# Patient Record
Sex: Female | Born: 1965 | Race: White | Hispanic: No | Marital: Single | Smoking: Never smoker
Health system: Southern US, Community
[De-identification: ages and names within clinical notes are randomized; demographics above are authoritative.]

## PROBLEM LIST (undated history)

## (undated) DIAGNOSIS — E119 Type 2 diabetes mellitus without complications: Secondary | ICD-10-CM

## (undated) DIAGNOSIS — I471 Supraventricular tachycardia, unspecified: Secondary | ICD-10-CM

## (undated) DIAGNOSIS — Z8679 Personal history of other diseases of the circulatory system: Secondary | ICD-10-CM

## (undated) DIAGNOSIS — I5189 Other ill-defined heart diseases: Secondary | ICD-10-CM

## (undated) DIAGNOSIS — I10 Essential (primary) hypertension: Secondary | ICD-10-CM

## (undated) DIAGNOSIS — F79 Unspecified intellectual disabilities: Secondary | ICD-10-CM

## (undated) DIAGNOSIS — E669 Obesity, unspecified: Secondary | ICD-10-CM

## (undated) DIAGNOSIS — E785 Hyperlipidemia, unspecified: Secondary | ICD-10-CM

## (undated) DIAGNOSIS — I2699 Other pulmonary embolism without acute cor pulmonale: Secondary | ICD-10-CM

## (undated) DIAGNOSIS — K8689 Other specified diseases of pancreas: Secondary | ICD-10-CM

## (undated) DIAGNOSIS — I480 Paroxysmal atrial fibrillation: Secondary | ICD-10-CM

## (undated) DIAGNOSIS — K061 Gingival enlargement: Secondary | ICD-10-CM

## (undated) DIAGNOSIS — I499 Cardiac arrhythmia, unspecified: Secondary | ICD-10-CM

## (undated) HISTORY — DX: Other ill-defined heart diseases: I51.89

## (undated) HISTORY — DX: Other pulmonary embolism without acute cor pulmonale: I26.99

## (undated) HISTORY — DX: Gingival enlargement: K06.1

## (undated) HISTORY — DX: Supraventricular tachycardia: I47.1

## (undated) HISTORY — DX: Hyperlipidemia, unspecified: E78.5

## (undated) HISTORY — DX: Cardiac arrhythmia, unspecified: I49.9

## (undated) HISTORY — DX: Supraventricular tachycardia, unspecified: I47.10

## (undated) HISTORY — DX: Personal history of other diseases of the circulatory system: Z86.79

## (undated) HISTORY — PX: OOPHORECTOMY: SHX86

## (undated) HISTORY — DX: Unspecified intellectual disabilities: F79

## (undated) HISTORY — DX: Paroxysmal atrial fibrillation: I48.0

## (undated) HISTORY — PX: ABDOMINAL HYSTERECTOMY: SHX81

## (undated) HISTORY — PX: TONSILLECTOMY: SUR1361

## (undated) HISTORY — DX: Other specified diseases of pancreas: K86.89

## (undated) HISTORY — DX: Obesity, unspecified: E66.9

---

## 2010-06-11 DIAGNOSIS — I2699 Other pulmonary embolism without acute cor pulmonale: Secondary | ICD-10-CM

## 2010-06-11 HISTORY — DX: Other pulmonary embolism without acute cor pulmonale: I26.99

## 2010-11-10 ENCOUNTER — Ambulatory Visit: Payer: Self-pay | Admitting: Internal Medicine

## 2010-12-10 ENCOUNTER — Ambulatory Visit: Payer: Self-pay | Admitting: Internal Medicine

## 2010-12-22 ENCOUNTER — Ambulatory Visit: Payer: Self-pay | Admitting: Unknown Physician Specialty

## 2011-01-10 ENCOUNTER — Ambulatory Visit: Payer: Self-pay | Admitting: Internal Medicine

## 2011-01-10 DIAGNOSIS — K8689 Other specified diseases of pancreas: Secondary | ICD-10-CM

## 2011-01-10 HISTORY — DX: Other specified diseases of pancreas: K86.89

## 2011-02-10 DIAGNOSIS — I499 Cardiac arrhythmia, unspecified: Secondary | ICD-10-CM

## 2011-02-10 HISTORY — DX: Cardiac arrhythmia, unspecified: I49.9

## 2011-02-10 HISTORY — PX: SPLENECTOMY: SUR1306

## 2011-03-29 ENCOUNTER — Encounter: Payer: Self-pay | Admitting: Cardiovascular Disease

## 2011-03-29 ENCOUNTER — Ambulatory Visit (INDEPENDENT_AMBULATORY_CARE_PROVIDER_SITE_OTHER): Payer: Medicaid Other | Admitting: Cardiovascular Disease

## 2011-03-29 VITALS — BP 100/68 | HR 87 | Resp 16 | Ht 68.0 in | Wt 183.8 lb

## 2011-03-29 DIAGNOSIS — I471 Supraventricular tachycardia, unspecified: Secondary | ICD-10-CM | POA: Insufficient documentation

## 2011-03-29 DIAGNOSIS — I4891 Unspecified atrial fibrillation: Secondary | ICD-10-CM

## 2011-03-29 DIAGNOSIS — E1169 Type 2 diabetes mellitus with other specified complication: Secondary | ICD-10-CM | POA: Insufficient documentation

## 2011-03-29 DIAGNOSIS — I2699 Other pulmonary embolism without acute cor pulmonale: Secondary | ICD-10-CM

## 2011-03-29 DIAGNOSIS — K869 Disease of pancreas, unspecified: Secondary | ICD-10-CM

## 2011-03-29 DIAGNOSIS — E785 Hyperlipidemia, unspecified: Secondary | ICD-10-CM

## 2011-03-29 MED ORDER — METOPROLOL TARTRATE 50 MG PO TABS
50.0000 mg | ORAL_TABLET | Freq: Two times a day (BID) | ORAL | Status: DC
Start: 2011-03-29 — End: 2021-05-08

## 2011-03-29 MED ORDER — DILTIAZEM HCL ER COATED BEADS 240 MG PO CP24: 240.0000 mg | ORAL_CAPSULE | Freq: Every day | ORAL | Status: DC

## 2011-03-29 NOTE — Assessment & Plan Note (Signed)
History of PE. This is likely secondary to a prolonged sedentary. Following her surgery and inadequate DVT prophylaxis. She is on warfarin for at least 6 months.

## 2011-03-29 NOTE — Assessment & Plan Note (Signed)
Notes indicate a history of hyperlipidemia.  We would be in no rush to add a statin given the continued weight loss.

## 2011-03-29 NOTE — Assessment & Plan Note (Addendum)
She is currently maintaining normal sinus rhythm. I suspect the atrial fibrillation could have come about secondary to the postoperative stressors, including the PE, pain, fluids given during surgery, etc.  Her blood pressure is low on today's visit and she is on a very high dose of diltiazem. Sinus Rate continues to be mildly elevated. We will decrease the diltiazem back to 240 mg daily and add metoprolol tartrate 50 mg b.i.d.. I've asked her family member to closely monitor her blood pressure and heart rate and pulse and call us with some numbers In the next week.  We will try to obtain any cardiac studies such as echocardiogram from [facility].

## 2011-03-29 NOTE — Assessment & Plan Note (Signed)
Notes indicate a benign lesion. She appears to be recovering well though does continue to have nausea, poor p.o. intake

## 2011-03-29 NOTE — Patient Instructions (Addendum)
You are doing well.  Your physician has recommended you make the following change in your medication: DECREASE Diltiazem to 240mg  ONE tablet at night. START Metoprolol Tartrate 50mg  Twice daily.   Monitor the heart rate and blood pressure Call the office if the blood pressure is running low (100 or less on the top)  Please call us if you have new issues that need to be addressed before your next appt.  The office will contact you for a follow up Appt. In 6 months

## 2011-03-29 NOTE — Progress Notes (Signed)
Patient ID: Briana Young, female    DOB: [birth year], 45 y.o.   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 Greggory Stallion for evaluation of her cardiac issues.  She reports that currently she feels well. She has TPN as she is not eating very much. She does have chronic nausea. She has lost 35 pounds since the surgery. She denies any significant lower extremity edema. No significant shortness of breath with exertion. She denies any tachypalpitations concerning for atrial fibrillation. She does have occasional lightheadedness.  She reports that she was on verapamil 240 mg daily for a long period of time. This was recently changed to diltiazem. We contacted [facility] and they report that the prescription is for diltiazem is 240 mg, 2 tabs daily For a total of 480 mg per day  EKG today shows normal sinus rhythm with rate 88 beats per minute, first degree AV block, no significant ST or T wave changes    Outpatient Encounter Prescriptions as of 03/29/2011  Medication Sig Dispense Refill  . acetaminophen (TYLENOL) 325 MG tablet Take 650 mg by mouth every 6 (six) hours as needed.        . ciprofloxacin (CIPRO) 500 MG tablet Take 500 mg by mouth 2 (two) times daily.        Marland Kitchen diltiazem (CARDIZEM CD) 240 MG 24 hr capsule Take 1 capsule (240 mg total) by mouth at bedtime. Take one tablet at bedtime.  30 capsule  6  . estropipate (OGEN) 1.5 MG tablet Take 2-4 tablets daily.       Marland Kitchen loperamide (IMODIUM A-D) 2 MG tablet Take 2 mg by mouth 4 (four) times daily as needed.        Marland Kitchen omeprazole (PRILOSEC) 20 MG capsule Take 20 mg by mouth daily.        . ondansetron (ZOFRAN) 4 MG tablet Take 4 mg by mouth every 8 (eight) hours as needed.        Marland Kitchen oxybutynin  (DITROPAN) 5 MG tablet Take 5 mg by mouth 3 (three) times daily.        Marland Kitchen PARoxetine (PAXIL) 30 MG tablet Take 30 mg by mouth every morning.        Marland Kitchen POLYETHYLENE GLYCOL 3350 PO Take by mouth.        . promethazine (PHENERGAN) 12.5 MG tablet Take 12.5 mg by mouth every 6 (six) hours as needed.        . warfarin (COUMADIN) 5 MG tablet Take 5 mg by mouth daily.        Marland Kitchen  diltiazem (CARDIZEM CD) 240 MG 24 hr capsule Take 240 mg by mouth daily. Take two tablet at bedtime.         Review of Systems  Constitutional: Positive for activity change, appetite change, fatigue and unexpected weight change.  HENT: Negative.   Eyes: Negative.   Respiratory: Negative.   Cardiovascular: Negative.   Gastrointestinal: Negative.   Musculoskeletal: Negative.   Skin: Negative.   Neurological: Positive for dizziness and weakness.  Hematological: Negative.   Psychiatric/Behavioral: Negative.   All other systems reviewed and are negative.    BP 100/68  Pulse 87  Resp 16  Ht 5\' 8"  (1.727 m)  Wt 183 lb 12 oz (83.348 kg)  BMI 27.94 kg/m2   Physical Exam  Nursing note and vitals reviewed. Constitutional: She is oriented to person, place, and time. She appears well-developed and well-nourished.  HENT:  Head: Normocephalic.  Nose: Nose normal.  Mouth/Throat: Oropharynx is clear and moist.  Eyes: Conjunctivae are normal. Pupils are equal, round, and reactive to light.  Neck: Normal range of motion. Neck supple. No JVD present.  Cardiovascular: Regular rhythm, S1 normal, S2 normal, normal heart sounds and intact distal pulses.  Tachycardia present.  Exam reveals no gallop and no friction rub.   No murmur heard. Pulmonary/Chest: Effort normal and breath sounds normal. No respiratory distress. She has no wheezes. She has no rales. She exhibits no tenderness.  Abdominal: Soft. Bowel sounds are normal. She exhibits no distension. There is no tenderness.  Musculoskeletal: Normal range of motion. She exhibits  no edema and no tenderness.  Lymphadenopathy:    She has no cervical adenopathy.  Neurological: She is alert and oriented to person, place, and time. Coordination normal.  Skin: Skin is warm and dry. No rash noted. No erythema.  Psychiatric: She has a normal mood and affect. Her behavior is normal. Judgment and thought content normal.         Assessment and Plan

## 2011-04-05 ENCOUNTER — Ambulatory Visit: Payer: Self-pay | Admitting: Internal Medicine

## 2011-04-12 ENCOUNTER — Ambulatory Visit: Payer: Self-pay | Admitting: Internal Medicine

## 2011-09-27 ENCOUNTER — Ambulatory Visit: Payer: Self-pay | Admitting: Family Medicine

## 2016-01-12 ENCOUNTER — Other Ambulatory Visit: Payer: Self-pay | Admitting: Family Medicine

## 2016-01-12 DIAGNOSIS — Z1231 Encounter for screening mammogram for malignant neoplasm of breast: Secondary | ICD-10-CM

## 2016-01-26 ENCOUNTER — Ambulatory Visit
Admission: RE | Admit: 2016-01-26 | Discharge: 2016-01-26 | Disposition: A | Discharge status: Home / Self Care | Payer: Medicare Other | Source: Ambulatory Visit | Attending: Family Medicine | Admitting: Family Medicine

## 2016-01-26 ENCOUNTER — Encounter: Payer: Self-pay | Admitting: Radiology

## 2016-01-26 DIAGNOSIS — Z1231 Encounter for screening mammogram for malignant neoplasm of breast: Secondary | ICD-10-CM | POA: Insufficient documentation

## 2017-11-29 ENCOUNTER — Encounter: Payer: Self-pay | Admitting: Emergency Medicine

## 2017-11-29 ENCOUNTER — Other Ambulatory Visit: Payer: Self-pay

## 2017-11-29 ENCOUNTER — Ambulatory Visit
Admission: EM | Admit: 2017-11-29 | Discharge: 2017-11-29 | Disposition: A | Discharge status: Home / Self Care | Payer: Medicare Other | Attending: Emergency Medicine | Admitting: Emergency Medicine

## 2017-11-29 DIAGNOSIS — S76212A Strain of adductor muscle, fascia and tendon of left thigh, initial encounter: Secondary | ICD-10-CM | POA: Diagnosis not present

## 2017-11-29 NOTE — ED Provider Notes (Signed)
HPI  SUBJECTIVE:  Briana Young is a 52 y.o. female who presents with 2 days of sharp left groin pain present only with walking.  She denies nausea, vomiting, fevers, abdominal pain.  No urinary complaints.  No back pain.  No hip pain, swelling, labial swelling.  Caregiver states that she was dancing and the symptoms started the day after.  states that normally she is very sedentary.  No fall, trauma to the area.  Caregiver states that the patient is limping.  They tried Tylenol with improvement in her symptoms.  Symptoms are worse with flexion, abduction/adduction and with walking.  Past medical history of diabetes, atrial fibrillation on Coumadin, PE, mental retardation, UTI.  No history of osteoarthritis of the hip, hernia.  [facility]  History obtained from caregivers and patient.  Past Medical History:  Diagnosis Date  . Allergic rhinitis   . Arrhythmia 02/2011   A-fib  during admission for pancreatic mass excision with subsequent development of PE  . Gingival hyperplasia   . History of atrial tachycardia    premature  . Hyperlipidemia   . Mental retardation   . Obesity   . Pancreatic mass 01/2011   benign  . Pulmonary embolism (HCC) 2012   on coumadin    Past Surgical History:  Procedure Laterality Date  . 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 TONSILLECTOMY      Family History  Problem Relation Age of Onset  . Heart disease Father     Social History   Tobacco Use  . Smoking status: Never Smoker  Substance Use Topics  . Alcohol use: No  . Drug use: Not on file    No current facility-administered medications for this encounter.   Current Outpatient Medications:  .  cetirizine (ZYRTEC) 10 MG chewable tablet, Chew 10 mg by mouth daily., Disp: , Rfl:  .  fluticasone (FLONASE) 50 MCG/ACT nasal spray, Place into both nostrils daily., Disp: , Rfl:  .  glipiZIDE (GLUCOTROL) 5 MG tablet, Take by mouth daily before breakfast., Disp: , Rfl:  .   lisinopril (PRINIVIL,ZESTRIL) 2.5 MG tablet, Take 2.5 mg by mouth daily., Disp: , Rfl:  .  metFORMIN (GLUCOPHAGE) 1000 MG tablet, Take 1,000 mg by mouth 2 (two) times daily with a meal., Disp: , Rfl:  .  acetaminophen (TYLENOL) 325 MG tablet, Take 650 mg by mouth every 6 (six) hours as needed.  , Disp: , Rfl:  .  ciprofloxacin (CIPRO) 500 MG tablet, Take 500 mg by mouth 2 (two) times daily.  , Disp: , Rfl:  .  diltiazem (CARDIZEM CD) 240 MG 24 hr capsule, Take 1 capsule (240 mg total) by mouth at bedtime. Take one tablet at bedtime., Disp: 30 capsule, Rfl: 6 .  estropipate (OGEN) 1.5 MG tablet, Take 2-4 tablets daily. , Disp: , Rfl:  .  loperamide (IMODIUM A-D) 2 MG tablet, Take 2 mg by mouth 4 (four) times daily as needed.  , Disp: , Rfl:  .  metoprolol (LOPRESSOR) 50 MG tablet, Take 1 tablet (50 mg total) by mouth 2 (two) times daily., Disp: 60 tablet, Rfl: 6 .  omeprazole (PRILOSEC) 20 MG capsule, Take 20 mg by mouth daily.  , Disp: , Rfl:  .  ondansetron (ZOFRAN) 4 MG tablet, Take 4 mg by mouth every 8 (eight) hours as needed.  , Disp: , Rfl:  .  oxybutynin (DITROPAN) 5 MG tablet, Take 5 mg by mouth 3 (three)  times daily.  , Disp: , Rfl:  .  PARoxetine (PAXIL) 30 MG tablet, Take 30 mg by mouth every morning.  , Disp: , Rfl:  .  POLYETHYLENE GLYCOL 3350 PO, Take by mouth.  , Disp: , Rfl:  .  promethazine (PHENERGAN) 12.5 MG tablet, Take 12.5 mg by mouth every 6 (six) hours as needed.  , Disp: , Rfl:  .  warfarin (COUMADIN) 5 MG tablet, Take 5 mg by mouth daily.  , Disp: , Rfl:   No Known Allergies   ROS  As noted in HPI.   Physical Exam  BP 109/62   Pulse 81   Temp 98.4 F (36.9 C) (Oral)   Resp 18   Ht 5\' 8"  (1.727 m)   Wt 174 lb (78.9 kg)   SpO2 97%   BMI 26.46 kg/m   Constitutional: Well developed, well nourished, no acute distress Eyes:  EOMI, conjunctiva normal bilaterally HENT: Normocephalic, atraumatic,mucus membranes moist Respiratory: Normal inspiratory  effort Cardiovascular: Normal rate GI: Normal appearance, soft, nontender, nondistended, active bowel sounds.  No guarding, rebound. Back: No CVAT. GU: Normal external labia.  Mild tenderness along the left labia.  No swelling, erythema.  No palpable masses.  No bulging with Valsalva.  No tenderness along the inguinal area.  Caregivers present during exam skin: No rash, skin intact Musculoskeletal: no deformities, normal appearance of the inner thigh/groin area.  Positive tenderness at the insertion of the adductor longus/inner left groin.  Pain with abduction/adduction, internal/external rotation, flexion.  No pain with extension.  No rash.  No tenderness along the thigh, hip, knee.  Normal appearance of the proximal leg and knee.Marland Kitchen  Positive antalgic gait. Neurologic: Alert & oriented x 3, no focal neuro deficits Psychiatric: Speech and behavior appropriate   ED Course   Medications - No data to display  No orders of the defined types were placed in this encounter.   No results found for this or any previous visit (from the past 24 hour(s)). No results found.  ED Clinical Impression  Groin strain, left, initial encounter   ED Assessment/Plan  Patient most consistent with a pulled groin muscle.  There is no evidence of an inguinal hernia, labial infection, intra-abdominal process.  Her abdominal exam is completely benign.  Doubt septic joint as she is tender along the adductor longus At the insertion. Will increase her Tylenol 1000 mg every 6-8 hours as needed for pain.  No muscle relaxants as I do not want to increase her risk for falls.  Ice for 20 minutes at a time.  Follow-up with Dr. Letta Pate, [facility] as needed.  To the ER if she gets worse.  Discussed MDM, treatment plan, and plan for follow-up with caregivers. Discussed sn/sx that should prompt return to the ED. caregivers agree with plan.   No orders of the defined types were placed in this encounter.   *This  clinic note was created using Dragon dictation software. Therefore, there may be occasional mistakes despite careful proofreading.   ?   Domenick Gong, MD 11/29/17 1137

## 2017-11-29 NOTE — ED Triage Notes (Signed)
Patient states she is having lower left side abdominal pain, states it is better when lying down but worse when walking, it has been approx. 3 days

## 2017-11-29 NOTE — Discharge Instructions (Addendum)
Two 500 mg Tylenols, which is 1000 mg a day, 3 or 4 times a day as needed for pain.  Ice for 20 minutes at a time.  Go to the ER for fevers above 100.4, labial swelling, vomiting, abdominal pain, or other concerns

## 2018-02-01 ENCOUNTER — Ambulatory Visit
Admission: EM | Admit: 2018-02-01 | Discharge: 2018-02-01 | Disposition: A | Discharge status: Home / Self Care | Payer: Medicare Other | Attending: Physician Assistant | Admitting: Physician Assistant

## 2018-02-01 ENCOUNTER — Ambulatory Visit: Payer: Medicare Other

## 2018-02-01 ENCOUNTER — Other Ambulatory Visit: Payer: Self-pay

## 2018-02-01 DIAGNOSIS — Z9081 Acquired absence of spleen: Secondary | ICD-10-CM | POA: Insufficient documentation

## 2018-02-01 DIAGNOSIS — F79 Unspecified intellectual disabilities: Secondary | ICD-10-CM | POA: Diagnosis not present

## 2018-02-01 DIAGNOSIS — R42 Dizziness and giddiness: Secondary | ICD-10-CM | POA: Diagnosis not present

## 2018-02-01 DIAGNOSIS — I10 Essential (primary) hypertension: Secondary | ICD-10-CM | POA: Diagnosis not present

## 2018-02-01 DIAGNOSIS — Z7901 Long term (current) use of anticoagulants: Secondary | ICD-10-CM | POA: Diagnosis not present

## 2018-02-01 DIAGNOSIS — E119 Type 2 diabetes mellitus without complications: Secondary | ICD-10-CM | POA: Insufficient documentation

## 2018-02-01 DIAGNOSIS — I4891 Unspecified atrial fibrillation: Secondary | ICD-10-CM | POA: Insufficient documentation

## 2018-02-01 DIAGNOSIS — S20211A Contusion of right front wall of thorax, initial encounter: Secondary | ICD-10-CM | POA: Diagnosis not present

## 2018-02-01 DIAGNOSIS — M791 Myalgia, unspecified site: Secondary | ICD-10-CM | POA: Diagnosis present

## 2018-02-01 DIAGNOSIS — N3000 Acute cystitis without hematuria: Secondary | ICD-10-CM | POA: Diagnosis not present

## 2018-02-01 DIAGNOSIS — E785 Hyperlipidemia, unspecified: Secondary | ICD-10-CM | POA: Insufficient documentation

## 2018-02-01 DIAGNOSIS — Z86711 Personal history of pulmonary embolism: Secondary | ICD-10-CM | POA: Diagnosis not present

## 2018-02-01 DIAGNOSIS — Z7984 Long term (current) use of oral hypoglycemic drugs: Secondary | ICD-10-CM | POA: Diagnosis not present

## 2018-02-01 DIAGNOSIS — W19XXXA Unspecified fall, initial encounter: Secondary | ICD-10-CM | POA: Diagnosis not present

## 2018-02-01 DIAGNOSIS — Z8249 Family history of ischemic heart disease and other diseases of the circulatory system: Secondary | ICD-10-CM | POA: Diagnosis not present

## 2018-02-01 DIAGNOSIS — Z7951 Long term (current) use of inhaled steroids: Secondary | ICD-10-CM | POA: Diagnosis not present

## 2018-02-01 DIAGNOSIS — Z79899 Other long term (current) drug therapy: Secondary | ICD-10-CM | POA: Insufficient documentation

## 2018-02-01 LAB — URINALYSIS, COMPLETE (UACMP) WITH MICROSCOPIC
BILIRUBIN URINE: NEGATIVE
Glucose, UA: NEGATIVE mg/dL
HGB URINE DIPSTICK: NEGATIVE
KETONES UR: NEGATIVE mg/dL
Leukocytes, UA: NEGATIVE
NITRITE: NEGATIVE
PROTEIN: NEGATIVE mg/dL
SPECIFIC GRAVITY, URINE: 1.015 (ref 1.005–1.030)
pH: 6 (ref 5.0–8.0)

## 2018-02-01 MED ORDER — SULFAMETHOXAZOLE-TRIMETHOPRIM 800-160 MG PO TABS: 1.0000 | ORAL_TABLET | Freq: Two times a day (BID) | ORAL | 0 refills | Status: AC

## 2018-02-01 NOTE — ED Provider Notes (Addendum)
[facility]    CSN: [id number] Arrival date & time: 02/01/18  [id number]     History   Chief Complaint Chief Complaint  Patient presents with  . Muscle Pain    HPI Briana Young is a 52 y.o. female. Patient presents with caregiver. She is from a group home. Caregiver and patient say that she fell onto her right side last night. The fall was not witnessed and the patient complained about the fall and subsequent right rib pain this morning to staff. She says that it does hurt to take a deep breath, but denies breathing difficulty. She states that she gets dizzy sometimes. Caregiver denies frequent falls. Patient does have a history of diabetes, hypertension with low normal BP readings, and mental retardation. Caregiver states fingerstick glucose was 118 this morning, just prior to arrival to the Urgent Care. Denies LOC and headache. Denies other injuries. There are no lacerations, bruises, abrasions.   HPI  Past Medical History:  Diagnosis Date  . Allergic rhinitis   . Arrhythmia 02/2011   A-fib  during admission for pancreatic mass excision with subsequent development of PE  . Gingival hyperplasia   . History of atrial tachycardia    premature  . Hyperlipidemia   . Mental retardation   . Obesity   . Pancreatic mass 01/2011   benign  . Pulmonary embolism (HCC) 2012   on coumadin    Patient Active Problem List   Diagnosis Date Noted  . Atrial fibrillation (HCC) 03/29/2011  . Hyperlipidemia 03/29/2011  . Pulmonary embolism (HCC) 03/29/2011  . Pancreatic disorder 03/29/2011    Past Surgical History:  Procedure Laterality Date  . 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 TONSILLECTOMY      OB History   None      Home Medications    Prior to Admission medications   Medication Sig Start Date End Date Taking? Authorizing Provider  acetaminophen (TYLENOL) 325 MG tablet Take 650 mg by mouth every 6 (six) hours as needed.      [provider]  cetirizine (ZYRTEC)  10 MG chewable tablet Chew 10 mg by mouth daily.    [provider]  ciprofloxacin (CIPRO) 500 MG tablet Take 500 mg by mouth 2 (two) times daily.      [provider]  diltiazem (CARDIZEM CD) 240 MG 24 hr capsule Take 1 capsule (240 mg total) by mouth at bedtime. Take one tablet at bedtime. 03/29/11   Antonieta IbaGollan, Timothy J, MD  estropipate (OGEN) 1.5 MG tablet Take 2-4 tablets daily.     [provider]  fluticasone (FLONASE) 50 MCG/ACT nasal spray Place into both nostrils daily.    [provider]  glipiZIDE (GLUCOTROL) 5 MG tablet Take by mouth daily before breakfast.    [provider]  lisinopril (PRINIVIL,ZESTRIL) 2.5 MG tablet Take 2.5 mg by mouth daily.    [provider]  loperamide (IMODIUM A-D) 2 MG tablet Take 2 mg by mouth 4 (four) times daily as needed.      [provider]  metFORMIN (GLUCOPHAGE) 1000 MG tablet Take 1,000 mg by mouth 2 (two) times daily with a meal.    [provider]  metoprolol (LOPRESSOR) 50 MG tablet Take 1 tablet (50 mg total) by mouth 2 (two) times daily. 03/29/11 03/28/12  Antonieta IbaGollan, Timothy J, MD  omeprazole (PRILOSEC) 20 MG capsule Take 20 mg by mouth daily.      [provider]  ondansetron [facility](ZOFRAN) 4  MG tablet Take 4 mg by mouth every 8 (eight) hours as needed.      [provider]  oxybutynin (DITROPAN) 5 MG tablet Take 5 mg by mouth 3 (three) times daily.      [provider]  PARoxetine (PAXIL) 30 MG tablet Take 30 mg by mouth every morning.      [provider]  POLYETHYLENE GLYCOL 3350 PO Take by mouth.      [provider]  promethazine (PHENERGAN) 12.5 MG tablet Take 12.5 mg by mouth every 6 (six) hours as needed.      [provider]  sulfamethoxazole-trimethoprim (BACTRIM DS,SEPTRA DS) 800-160 MG tablet Take 1 tablet by mouth 2 (two) times daily for 3 days. 02/01/18 02/04/18  Eusebio Friendly B, PA-C  warfarin (COUMADIN) 5 MG tablet  Take 5 mg by mouth daily.      [provider]    Family History Family History  Problem Relation Age of Onset  . Heart disease Father     Social History Social History   Tobacco Use  . Smoking status: Never Smoker  . Smokeless tobacco: Never Used  Substance Use Topics  . Alcohol use: No  . Drug use: Not on file     Allergies   Patient has no known allergies.   Review of Systems Review of Systems  Constitutional: Negative for fatigue and fever.  HENT: Negative for ear discharge.   Eyes: Negative for photophobia and visual disturbance.  Respiratory: Negative for cough, chest tightness, shortness of breath and wheezing.   Cardiovascular: Negative for chest pain and palpitations.  Gastrointestinal: Negative for abdominal pain, nausea and vomiting.  Genitourinary: Positive for dysuria. Negative for difficulty urinating and urgency.  Musculoskeletal: Positive for arthralgias (pain of right ribs) and myalgias (pain of muscles or right thorax). Negative for gait problem.  Skin: Negative for color change, rash and wound.  Neurological: Positive for dizziness. Negative for syncope and headaches.  Psychiatric/Behavioral: Negative for confusion.     Physical Exam Triage Vital Signs ED Triage Vitals [02/01/18 0907]  Enc Vitals Group     BP      Pulse      Resp      Temp      Temp src      SpO2      Weight      Height 5\' 6"  (1.676 m)     Head Circumference      Peak Flow      Pain Score 10     Pain Loc      Pain Edu?      Excl. in GC?    No data found.  Updated Vital Signs BP (!) 97/55 (BP Location: Left Arm)   Pulse 86   Temp 98.5 F (36.9 C) (Oral)   Resp 16   Ht 5\' 6"  (1.676 m)   SpO2 97%   BMI 28.08 kg/m       Physical Exam  Constitutional: She is oriented to person, place, and time. She appears well-developed and well-nourished. No distress.  HENT:  Head: Normocephalic and atraumatic.  Right Ear: External ear normal.  Left Ear: External  ear normal.  Nose: Nose normal.  Mouth/Throat: Oropharynx is clear and moist.  Eyes: Pupils are equal, round, and reactive to light. Conjunctivae and EOM are normal. No scleral icterus.  Neck: Normal range of motion. Neck supple.  Cardiovascular: Normal rate, regular rhythm and normal heart sounds.  Pulmonary/Chest: Effort normal and breath sounds  normal. No respiratory distress. She has no wheezes. She exhibits tenderness (ttp of right lower anterior and lateral ribs).  Neurological: She is alert and oriented to person, place, and time. No cranial nerve deficit.  Skin: Skin is warm and dry. No rash noted. She is not diaphoretic. No erythema.  Psychiatric: She has a normal mood and affect. Her behavior is normal.     UC Treatments / Results  Labs (all labs ordered are listed, but only abnormal results are displayed) Labs Reviewed  URINALYSIS, COMPLETE (UACMP) WITH MICROSCOPIC - Abnormal; Notable for the following components:      Result Value   APPearance HAZY (*)    Bacteria, UA FEW (*)    All other components within normal limits    EKG None  Radiology Dg Ribs Unilateral W/chest Right  Result Date: 02/01/2018 CLINICAL DATA:  Fall yesterday.  Right-sided pain. EXAM: RIGHT RIBS AND CHEST - 3+ VIEW COMPARISON:  None. FINDINGS: Normal heart size. Normal mediastinal contour. No pneumothorax. No pleural effusion. Lungs appear clear, with no acute consolidative airspace disease and no pulmonary edema. Surgical clips are noted in the left upper quadrant of the abdomen. No fracture or suspicious focal osseous region in the right ribs. IMPRESSION: No active cardiopulmonary disease. No evidence of right rib fracture. Should the patient's symptoms persist or worsen, repeat radiographs of the ribs in 10 - 14 days maybe of use to detect subtle nondisplaced rib fractures (which are commonly occult on initial imaging). Electronically Signed   By: Delbert Phenix M.D.   On: 02/01/2018 09:51     Procedures Procedures (including critical care time)  Medications Ordered in UC Medications - No data to display  Initial Impression / Assessment and Plan / UC Course  I have reviewed the triage vital signs and the nursing notes.  Pertinent labs & imaging results that were available during my care of the patient were reviewed by me and considered in my medical decision making (see chart for details).  Clinical Course as of Feb 02 1023  Sat Feb 01, 2018  1007 Bacteria, UA(!): FEW [LE]    Clinical Course User Index [LE] Shirlee Latch, PA-C  52 y/o female presenting with caregiver for recent fall. Complaints of right rib pain. X-rays or right ribs and chest indicate: no evidence of fractures or pneumo--radiographs overread by radiologist. Advised patient and caregiver to ice the area and take Tylenol/Motrin as prescribed for pain relief. F/u with PCP in 2-5 days for re-examination and if continued pain, it is suggested to have repeat imaging to ensure fracture not missed. She should go immediately to ER for SOB and breathing difficulty.  UA performed today for dysuria complaint and complaints of dizziness. UA results: few bacteria. Urine will be sent for culture. Treating with Bactrim DS x 3 days and will adjust medication pending culture results. Patient should increase rest and fluid intake. Dizziness may be due to UTI. Could also be due to BP. Advised that a log be brought to f/u with PCP to see if medication might need to be adjusted.    Final Clinical Impressions(s) / UC Diagnoses   Final diagnoses:  Contusion of rib on right side, initial encounter  Dizziness and giddiness  Acute cystitis without hematuria     Discharge Instructions     X-rays or right ribs and chest indicate: no evidence of fractures or pneumo--radiographs overread by radiologist. Advised patient and caregiver to ice the area and take Tylenol/Motrin as prescribed for pain  relief. F/u with PCP in 2-5 days  for re-examination and if continued pain, it is suggested to have repeat imaging to ensure fracture not missed. She should go immediately to ER for SOB and breathing difficulty.  UA performed today for dysuria complaint and complaints of dizziness. UA results: few bacteria. Urine will be sent for culture. Treating with Bactrim DS x 3 days and will adjust medication pending culture results. Patient should increase rest and fluid intake. Dizziness may be due to UTI. Could also be due to BP. Advised that a log be brought to f/u with PCP to see if medication might need to be adjusted.     ED Prescriptions    Medication Sig Dispense Auth. Provider   sulfamethoxazole-trimethoprim (BACTRIM DS,SEPTRA DS) 800-160 MG tablet Take 1 tablet by mouth 2 (two) times daily for 3 days. 6 tablet Shirlee Latch, PA-C     Controlled Substance Prescriptions [place] Controlled Substance Registry consulted? Not Applicable   Gareth Morgan 02/01/18 1024    Eusebio Friendly B, PA-C 02/01/18 1024

## 2018-02-01 NOTE — Discharge Instructions (Signed)
X-rays or right ribs and chest indicate: no evidence of fractures or pneumo--radiographs overread by radiologist. Advised patient and caregiver to ice the area and take Tylenol/Motrin as prescribed for pain relief. F/u with PCP in 2-5 days for re-examination and if continued pain, it is suggested to have repeat imaging to ensure fracture not missed. She should go immediately to ER for SOB and breathing difficulty.  UA performed today for dysuria complaint and complaints of dizziness. UA results: few bacteria. Urine will be sent for culture. Treating with Bactrim DS x 3 days and will adjust medication pending culture results. Patient should increase rest and fluid intake. Dizziness may be due to UTI. Could also be due to BP. Advised that a log be brought to f/u with PCP to see if medication might need to be adjusted.

## 2018-02-01 NOTE — ED Triage Notes (Signed)
Pt from a group home and was cleaning her room last night and she lost her balance and fell, striking her right side on her desk. States she gets "dizzy a lot" but denies other falls. Pain to right ribs and hurts to take a deep breath

## 2018-02-02 LAB — URINE CULTURE

## 2018-03-26 ENCOUNTER — Encounter: Payer: Self-pay | Admitting: *Deleted

## 2018-04-03 ENCOUNTER — Other Ambulatory Visit: Payer: Self-pay

## 2018-04-03 ENCOUNTER — Encounter: Payer: Self-pay | Admitting: Emergency Medicine

## 2018-04-03 ENCOUNTER — Ambulatory Visit
Admission: EM | Admit: 2018-04-03 | Discharge: 2018-04-03 | Disposition: A | Discharge status: Home / Self Care | Payer: Medicare Other | Attending: Family Medicine | Admitting: Family Medicine

## 2018-04-03 DIAGNOSIS — S0990XA Unspecified injury of head, initial encounter: Secondary | ICD-10-CM | POA: Diagnosis not present

## 2018-04-03 DIAGNOSIS — W19XXXA Unspecified fall, initial encounter: Secondary | ICD-10-CM

## 2018-04-03 DIAGNOSIS — M25551 Pain in right hip: Secondary | ICD-10-CM

## 2018-04-03 MED ORDER — MELOXICAM 15 MG PO TABS: 15.0000 mg | ORAL_TABLET | Freq: Every day | ORAL | 0 refills | Status: DC | PRN

## 2018-04-03 NOTE — ED Provider Notes (Signed)
[facility]    CSN: [id number] Arrival date & time: 04/03/18  1648   History   Chief Complaint Chief Complaint  Patient presents with  . Fall   HPI  52 year old female presents for evaluation after suffering a fall.  Of note, she is not currently on Coumadin.  She was on this previously for pulmonary embolism.  Patient states that she fell.  This occurred while she was at [facility].  She states that she lost her balance and fell.  She hit the right side of her head as well as her right hip.  She complains of pain at this time.  Appears comfortable.  No medications or interventions tried.  She was evaluated by EMS.  She was encouraged to get evaluated here.  She is brought in by a worker of her group home.  She is in a group home due to MR/intellectual disability.  No other reported symptoms.  No other complaints.  PMH, Surgical Hx, Family Hx, Social History reviewed as below.   Past Medical History:  Diagnosis Date  . Allergic rhinitis   . Arrhythmia 02/2011   A-fib  during admission for pancreatic mass excision with subsequent development of PE  . Gingival hyperplasia   . History of atrial tachycardia    premature  . Hyperlipidemia   . Mental retardation   . Obesity   . Pancreatic mass 01/2011   benign  . Pulmonary embolism (HCC) 2012   on coumadin    Patient Active Problem List   Diagnosis Date Noted  . Atrial fibrillation (HCC) 03/29/2011  . Hyperlipidemia 03/29/2011  . Pulmonary embolism (HCC) 03/29/2011  . Pancreatic disorder 03/29/2011    Past Surgical History:  Procedure Laterality Date  . 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 TONSILLECTOMY      OB History   None      Home Medications    Prior to Admission medications   Medication Sig Start Date End Date Taking? Authorizing Provider  acetaminophen (TYLENOL) 325 MG tablet Take 650 mg by mouth every 6 (six) hours as needed.     Yes [provider]  cetirizine (ZYRTEC) 10 MG chewable tablet  Chew 10 mg by mouth daily.   Yes [provider]  fluticasone (FLONASE) 50 MCG/ACT nasal spray Place into both nostrils daily.   Yes [provider]  glipiZIDE (GLUCOTROL) 5 MG tablet Take by mouth daily before breakfast.   Yes [provider]  lisinopril (PRINIVIL,ZESTRIL) 2.5 MG tablet Take 2.5 mg by mouth daily.   Yes [provider]  omeprazole (PRILOSEC) 20 MG capsule Take 20 mg by mouth daily.     Yes [provider]  PARoxetine (PAXIL) 30 MG tablet Take 30 mg by mouth every morning.     Yes [provider]  POLYETHYLENE GLYCOL 3350 PO Take by mouth.     Yes [provider]  meloxicam (MOBIC) 15 MG tablet Take 1 tablet (15 mg total) by mouth daily as needed for pain. 04/03/18   Tommie Sams, DO  metoprolol (LOPRESSOR) 50 MG tablet Take 1 tablet (50 mg total) by mouth 2 (two) times daily. 03/29/11 03/28/12  Antonieta Iba, MD    Family History Family History  Problem Relation Age of Onset  . Heart disease Father     Social History Social History   Tobacco Use  . Smoking status: Never Smoker  . Smokeless tobacco: Never Used  Substance Use Topics  . Alcohol use:  No  . Drug use: Never     Allergies   Patient has no known allergies.   Review of Systems Review of Systems  Musculoskeletal:       Right hip pain.  Neurological:       Headache.   Physical Exam Triage Vital Signs ED Triage Vitals  Enc Vitals Group     BP 04/03/18 1706 101/62     Pulse Rate 04/03/18 1706 88     Resp 04/03/18 1706 16     Temp 04/03/18 1706 98.2 F (36.8 C)     Temp Source 04/03/18 1706 Oral     SpO2 04/03/18 1706 98 %     Weight --      Height 04/03/18 1659 5\' 6"  (1.676 m)     Head Circumference --      Peak Flow --      Pain Score --      Pain Loc --      Pain Edu? --      Excl. in GC? --    Updated Vital Signs BP 101/62 (BP Location: Left Arm)   Pulse 88   Temp 98.2 F (36.8 C) (Oral)   Resp 16   Ht 5\' 6"   (1.676 m)   SpO2 98%   BMI 28.08 kg/m   Visual Acuity Right Eye Distance:   Left Eye Distance:   Bilateral Distance:    Right Eye Near:   Left Eye Near:    Bilateral Near:     Physical Exam  Constitutional: She is oriented to person, place, and time. She appears well-developed. No distress.  HENT:  No appreciable hematomas or step-offs.  Eyes: Pupils are equal, round, and reactive to light. Conjunctivae and EOM are normal.  Cardiovascular: Normal rate and regular rhythm.  Pulmonary/Chest: Effort normal and breath sounds normal. She has no wheezes. She has no rales.  Musculoskeletal:  Right hip with greater trochanter tenderness to palpation.  Neurological: She is alert and oriented to person, place, and time. No cranial nerve deficit.  Normal muscle strength.  No apparent deficits.  Psychiatric: She has a normal mood and affect. Her behavior is normal.  Nursing note and vitals reviewed.  UC Treatments / Results  Labs (all labs ordered are listed, but only abnormal results are displayed) Labs Reviewed - No data to display  EKG None  Radiology No results found.  Procedures Procedures (including critical care time)  Medications Ordered in UC Medications - No data to display  Initial Impression / Assessment and Plan / UC Course  I have reviewed the triage vital signs and the nursing notes.  Pertinent labs & imaging results that were available during my care of the patient were reviewed by me and considered in my medical decision making (see chart for details).    52 year old female presents for evaluation after suffering a fall.  No indications for CT head.  Her neurologic exam is normal.  Appears to have traumatic bursitis of the right hip.  Treating with meloxicam.  Supportive care.  Final Clinical Impressions(s) / UC Diagnoses   Final diagnoses:  Pain of left hip joint  Fall, initial encounter  Injury of head, initial encounter     Discharge Instructions       Medication as needed.  If she worsens, please let us know.  Take care  Dr. Adriana Simas    ED Prescriptions    Medication Sig Dispense Auth. Provider   meloxicam (MOBIC) 15 MG tablet Take 1  tablet (15 mg total) by mouth daily as needed for pain. 14 tablet Tommie Sams, DO     Controlled Substance Prescriptions [place] Controlled Substance Registry consulted? Not Applicable   Tommie Sams, DO 04/03/18 1816

## 2018-04-03 NOTE — Discharge Instructions (Signed)
Medication as needed.  If she worsens, please let us know.  Take care  Dr. Adriana Simas

## 2018-04-03 NOTE — ED Triage Notes (Signed)
Pt fell today on a rug at her group home and his the right side of her head on the wall. Caregiver says the films at the group homes shows her tripping on the rug. EMS came and told her she did not need to go to the ED per caregiver but the group home owner said she needed to be seen.

## 2018-04-25 ENCOUNTER — Other Ambulatory Visit: Payer: Self-pay

## 2018-04-25 ENCOUNTER — Encounter: Payer: Self-pay | Admitting: Gastroenterology

## 2018-04-25 ENCOUNTER — Ambulatory Visit (INDEPENDENT_AMBULATORY_CARE_PROVIDER_SITE_OTHER): Payer: Medicare Other | Admitting: Gastroenterology

## 2018-04-25 VITALS — BP 108/73 | HR 93 | Resp 18 | Wt 176.0 lb

## 2018-04-25 DIAGNOSIS — R140 Abdominal distension (gaseous): Secondary | ICD-10-CM | POA: Diagnosis not present

## 2018-04-25 DIAGNOSIS — R197 Diarrhea, unspecified: Secondary | ICD-10-CM | POA: Diagnosis not present

## 2018-04-25 DIAGNOSIS — K862 Cyst of pancreas: Secondary | ICD-10-CM

## 2018-04-25 NOTE — Progress Notes (Signed)
Briana Repressohini R Ahliyah Nienow, MD [address]  [address]  [place], [place] [address]  Main: [phone]  Fax: [phone]    Gastroenterology Consultation  Referring Provider:     Emogene Young, Briana A, MD Primary Care Physician:  Briana Young, Briana A, MD Primary Gastroenterologist:  Dr. Arlyss Repressohini R Briana Young Reason for Consultation:   Chronic diarrhea        HPI:   Briana SpiesLynda K Young is Young 52 y.o. female referred by Dr. Emogene Young, Briana A, MD  for consultation & management of chronic diarrhea, abdominal bloating.  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 fib previously on anticoagulation.  She has been experiencing several months history of chronic nonbloody diarrhea, having bowel movements which are soft up to 5 times Young day.  Denies nocturnal diarrhea.  She reports that she has to wipe several times after each BM as the stools are soft.  She reports abdominal bloating and mild upper abdominal discomfort.  She denies rectal bleeding.  She lost about 8 pounds in last 1 to 2 months.  Patient is Young group home resident due to history of mental retardation.  Patient reports that she did not have Young BM in the last 2 days  NSAIDs: None  Antiplts/Anticoagulants/Anti thrombotics: None  GI Procedures: Reports having undergone an endoscopy and colonoscopy several years ago when she was staying with her mom  Past Medical History:  Diagnosis Date  . Allergic rhinitis   . Arrhythmia 02/2011   Young-fib  during admission for pancreatic mass excision with subsequent development of PE  . Gingival hyperplasia   . History of atrial tachycardia    premature  . Hyperlipidemia   . Mental retardation   . Obesity   . Pancreatic mass 01/2011   benign  . Pulmonary embolism (HCC) 2012   on coumadin    Past Surgical History:  Procedure Laterality Date  . 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 TONSILLECTOMY      Current  Outpatient Medications:  .  acetaminophen (TYLENOL) 325 MG tablet, Take 650 mg by mouth every 6 (six) hours as needed.  , Disp: , Rfl:  .  cetirizine (ZYRTEC) 10 MG chewable tablet, Chew 10 mg by mouth daily., Disp: , Rfl:  .  fluticasone (FLONASE) 50 MCG/ACT nasal spray, Place into both nostrils daily., Disp: , Rfl:  .  glipiZIDE (GLUCOTROL) 5 MG tablet, Take by mouth daily before breakfast., Disp: , Rfl:  .  lisinopril (PRINIVIL,ZESTRIL) 2.5 MG tablet, Take 2.5 mg by mouth daily., Disp: , Rfl:  .  meloxicam (MOBIC) 15 MG tablet, Take 1 tablet (15 mg total) by mouth daily as needed for pain., Disp: 14 tablet, Rfl: 0 .  omeprazole (PRILOSEC) 20 MG capsule, Take 20 mg by mouth daily.  , Disp: , Rfl:  .  PARoxetine (PAXIL) 30 MG tablet, Take 30 mg by mouth every morning.  , Disp: , Rfl:  .  POLYETHYLENE GLYCOL 3350 PO, Take by mouth.  , Disp: , Rfl:  .  metoprolol (LOPRESSOR) 50 MG tablet, Take 1 tablet (50 mg total) by mouth 2 (two) times daily., Disp: 60 tablet, Rfl: 6    Family History  Problem Relation Age of Onset  . Heart disease Father      Social History   Tobacco Use  . Smoking status: Never Smoker  . Smokeless tobacco: Never Used  Substance Use Topics  .  Alcohol use: No  . Drug use: Never    Allergies as of 04/25/2018  . (No Known Allergies)    Review of Systems:    All systems reviewed and negative except where noted in HPI.   Physical Exam:  BP 108/73 (BP Location: Left Arm, Patient Position: Sitting, Cuff Size: Large)   Pulse 93   Resp 18   Wt 176 lb (79.8 kg)   BMI 28.41 kg/m  No LMP recorded. Patient has had Young hysterectomy.  General:   Alert,  Well-developed, well-nourished, pleasant and cooperative in NAD Head:  Normocephalic and atraumatic. Eyes:  Sclera clear, no icterus.   Conjunctiva pink. Ears:  Normal auditory acuity. Nose:  No deformity, discharge, or lesions. Mouth:  No deformity or lesions,oropharynx pink & moist. Neck:  Supple; no masses or  thyromegaly. Lungs:  Respirations even and unlabored.  Clear throughout to auscultation.   No wheezes, crackles, or rhonchi. No acute distress. Heart:  Regular rate and rhythm; no murmurs, clicks, rubs, or gallops. Abdomen:  Normal bowel sounds.  Old scar in the left upper quadrant, soft, mild tenderness in the left upper quadrant area and non-distended without masses, hepatosplenomegaly or hernias noted.  No guarding or rebound tenderness.   Rectal: Not performed Msk:  Symmetrical without gross deformities. Good, equal movement & strength bilaterally. Pulses:  Normal pulses noted. Extremities:  No clubbing or edema.  No cyanosis. Neurologic:  Alert and oriented x3;  grossly normal neurologically. Skin:  Intact without significant lesions or rashes. No jaundice. Psych:  Alert and cooperative. Normal mood and affect.  Imaging Studies: No recent abdominal imaging  Assessment and Plan:   Briana Young is Young 52 y.o. 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 pylori stool antigen -Check celiac serologies, CBC, CMP, CRP -Colonoscopy with biopsies -CT abdomen with and without contrast given history of pancreatic cysts   Follow up in 2 months or sooner if needed   Briana Repress, MD

## 2018-04-29 LAB — CBC
HEMATOCRIT: 34.6 % (ref 34.0–46.6)
Hemoglobin: 11.7 g/dL (ref 11.1–15.9)
MCH: 28.5 pg (ref 26.6–33.0)
MCHC: 33.8 g/dL (ref 31.5–35.7)
MCV: 84 fL (ref 79–97)
PLATELETS: 500 10*3/uL — AB (ref 150–450)
RBC: 4.11 x10E6/uL (ref 3.77–5.28)
RDW: 13.7 % (ref 12.3–15.4)
WBC: 9.6 10*3/uL (ref 3.4–10.8)

## 2018-04-29 LAB — COMPREHENSIVE METABOLIC PANEL
ALT: 17 IU/L (ref 0–32)
AST: 19 IU/L (ref 0–40)
Albumin/Globulin Ratio: 2 (ref 1.2–2.2)
Albumin: 4.3 g/dL (ref 3.5–5.5)
Alkaline Phosphatase: 75 IU/L (ref 39–117)
BILIRUBIN TOTAL: 0.2 mg/dL (ref 0.0–1.2)
BUN/Creatinine Ratio: 20 (ref 9–23)
BUN: 22 mg/dL (ref 6–24)
CALCIUM: 9.5 mg/dL (ref 8.7–10.2)
CHLORIDE: 98 mmol/L (ref 96–106)
CO2: 21 mmol/L (ref 20–29)
Creatinine, Ser: 1.09 mg/dL — ABNORMAL HIGH (ref 0.57–1.00)
GFR, EST AFRICAN AMERICAN: 67 mL/min/{1.73_m2} (ref >59)
GFR, EST NON AFRICAN AMERICAN: 59 mL/min/{1.73_m2} — AB (ref >59)
GLOBULIN, TOTAL: 2.2 g/dL (ref 1.5–4.5)
Glucose: 110 mg/dL — ABNORMAL HIGH (ref 65–99)
POTASSIUM: 4.9 mmol/L (ref 3.5–5.2)
SODIUM: 138 mmol/L (ref 134–144)
Total Protein: 6.5 g/dL (ref 6.0–8.5)

## 2018-04-29 LAB — TISSUE TRANSGLUTAMINASE ABS,IGG,IGA

## 2018-04-29 LAB — IGA: IgA/Immunoglobulin A, Serum: 150 mg/dL (ref 87–352)

## 2018-04-29 LAB — C-REACTIVE PROTEIN: CRP: 3 mg/L (ref 0–10)

## 2018-05-02 LAB — H. PYLORI ANTIGEN, STOOL: H pylori Ag, Stl: NEGATIVE

## 2018-05-02 LAB — PANCREATIC ELASTASE, FECAL: Pancreatic Elastase, Fecal: 396 ug Elast./g (ref >200)

## 2018-05-05 ENCOUNTER — Telehealth: Payer: Self-pay | Admitting: Gastroenterology

## 2018-05-05 NOTE — Telephone Encounter (Signed)
Briana [facility] from [facility] is calling regarding pt procedure 05/15/2018 she states  Pt will not be able to drink the liquid for the procedure if pt can do Miralax or something else instead please call

## 2018-05-06 ENCOUNTER — Ambulatory Visit
Admission: RE | Admit: 2018-05-06 | Discharge: 2018-05-06 | Disposition: A | Discharge status: Home / Self Care | Payer: Medicare Other | Source: Ambulatory Visit | Attending: Gastroenterology | Admitting: Gastroenterology

## 2018-05-06 DIAGNOSIS — K862 Cyst of pancreas: Secondary | ICD-10-CM | POA: Insufficient documentation

## 2018-05-06 HISTORY — DX: Essential (primary) hypertension: I10

## 2018-05-06 HISTORY — DX: Type 2 diabetes mellitus without complications: E11.9

## 2018-05-06 MED ORDER — IOPAMIDOL (ISOVUE-300) INJECTION 61%
100.0000 mL | Freq: Once | INTRAVENOUS | Status: AC | PRN
  Administered 2018-05-06: 100 mL via INTRAVENOUS

## 2018-05-06 NOTE — Telephone Encounter (Signed)
Spoke with Briana [facility] from [facility] and provided instructions for bowel prep using Ducolax and Miralax, caretaker verbalized understanding, also called into pharmacy

## 2018-05-13 ENCOUNTER — Other Ambulatory Visit: Payer: Self-pay

## 2018-05-15 ENCOUNTER — Ambulatory Visit: Payer: Medicare Other | Admitting: Anesthesiology

## 2018-05-15 ENCOUNTER — Encounter
Admission: RE | Disposition: A | Discharge status: Home / Self Care | Payer: Self-pay | Source: Ambulatory Visit | Attending: Gastroenterology

## 2018-05-15 ENCOUNTER — Ambulatory Visit
Admission: RE | Admit: 2018-05-15 | Discharge: 2018-05-15 | Disposition: A | Discharge status: Home / Self Care | Payer: Medicare Other | Source: Ambulatory Visit | Attending: Gastroenterology | Admitting: Gastroenterology

## 2018-05-15 ENCOUNTER — Encounter: Payer: Self-pay | Admitting: Emergency Medicine

## 2018-05-15 DIAGNOSIS — I509 Heart failure, unspecified: Secondary | ICD-10-CM | POA: Insufficient documentation

## 2018-05-15 DIAGNOSIS — Z86711 Personal history of pulmonary embolism: Secondary | ICD-10-CM | POA: Diagnosis not present

## 2018-05-15 DIAGNOSIS — Z791 Long term (current) use of non-steroidal anti-inflammatories (NSAID): Secondary | ICD-10-CM | POA: Insufficient documentation

## 2018-05-15 DIAGNOSIS — K529 Noninfective gastroenteritis and colitis, unspecified: Secondary | ICD-10-CM | POA: Insufficient documentation

## 2018-05-15 DIAGNOSIS — Z79899 Other long term (current) drug therapy: Secondary | ICD-10-CM | POA: Insufficient documentation

## 2018-05-15 DIAGNOSIS — Z6824 Body mass index (BMI) 24.0-24.9, adult: Secondary | ICD-10-CM | POA: Insufficient documentation

## 2018-05-15 DIAGNOSIS — K219 Gastro-esophageal reflux disease without esophagitis: Secondary | ICD-10-CM | POA: Insufficient documentation

## 2018-05-15 DIAGNOSIS — J309 Allergic rhinitis, unspecified: Secondary | ICD-10-CM | POA: Diagnosis not present

## 2018-05-15 DIAGNOSIS — Z7984 Long term (current) use of oral hypoglycemic drugs: Secondary | ICD-10-CM | POA: Insufficient documentation

## 2018-05-15 DIAGNOSIS — K862 Cyst of pancreas: Secondary | ICD-10-CM

## 2018-05-15 DIAGNOSIS — E119 Type 2 diabetes mellitus without complications: Secondary | ICD-10-CM | POA: Insufficient documentation

## 2018-05-15 DIAGNOSIS — I110 Hypertensive heart disease with heart failure: Secondary | ICD-10-CM | POA: Diagnosis not present

## 2018-05-15 DIAGNOSIS — Z7951 Long term (current) use of inhaled steroids: Secondary | ICD-10-CM | POA: Diagnosis not present

## 2018-05-15 DIAGNOSIS — Z7901 Long term (current) use of anticoagulants: Secondary | ICD-10-CM | POA: Insufficient documentation

## 2018-05-15 DIAGNOSIS — E669 Obesity, unspecified: Secondary | ICD-10-CM | POA: Diagnosis not present

## 2018-05-15 DIAGNOSIS — F79 Unspecified intellectual disabilities: Secondary | ICD-10-CM | POA: Insufficient documentation

## 2018-05-15 HISTORY — PX: COLONOSCOPY WITH PROPOFOL: SHX5780

## 2018-05-15 LAB — GLUCOSE, CAPILLARY: Glucose-Capillary: 122 mg/dL — ABNORMAL HIGH (ref 70–99)

## 2018-05-15 SURGERY — COLONOSCOPY WITH PROPOFOL
Anesthesia: General

## 2018-05-15 MED ORDER — MIDAZOLAM HCL 2 MG/2ML IJ SOLN
INTRAMUSCULAR | Status: AC
  Filled 2018-05-15: qty 2

## 2018-05-15 MED ORDER — MIDAZOLAM HCL 2 MG/2ML IJ SOLN
INTRAMUSCULAR | Status: DC | PRN
  Administered 2018-05-15: 1 mg via INTRAVENOUS

## 2018-05-15 MED ORDER — PROPOFOL 10 MG/ML IV BOLUS
INTRAVENOUS | Status: DC | PRN
  Administered 2018-05-15 (×2): 36.3 mg via INTRAVENOUS

## 2018-05-15 MED ORDER — FENTANYL CITRATE (PF) 100 MCG/2ML IJ SOLN
INTRAMUSCULAR | Status: AC
  Filled 2018-05-15: qty 2

## 2018-05-15 MED ORDER — PROPOFOL 500 MG/50ML IV EMUL
INTRAVENOUS | Status: AC
  Filled 2018-05-15: qty 50

## 2018-05-15 MED ORDER — FENTANYL CITRATE (PF) 100 MCG/2ML IJ SOLN
INTRAMUSCULAR | Status: DC | PRN
  Administered 2018-05-15: 50 ug via INTRAVENOUS

## 2018-05-15 MED ORDER — SODIUM CHLORIDE 0.9 % IV SOLN
INTRAVENOUS | Status: DC
  Administered 2018-05-15: 1000 mL via INTRAVENOUS

## 2018-05-15 MED ORDER — LIDOCAINE HCL (PF) 2 % IJ SOLN
INTRAMUSCULAR | Status: AC
  Filled 2018-05-15: qty 10

## 2018-05-15 MED ORDER — PROPOFOL 500 MG/50ML IV EMUL
INTRAVENOUS | Status: DC | PRN
  Administered 2018-05-15: 140 ug/kg/min via INTRAVENOUS

## 2018-05-15 NOTE — Op Note (Signed)
[facility] Gastroenterology Patient Name: Briana Young Procedure Date: 05/15/2018 9:11 AM MRN: [id number] Account #: [id number] Date of Birth: 08-02-1965 Admit Type: Outpatient Age: 52 Room: [facility] ENDO ROOM 2 Gender: Female Note Status: Finalized Procedure:            Colonoscopy Indications:          Chronic diarrhea Providers:            Toney Reil MD, MD Referring MD:         Sylvie Farrier. Aycock MD (Referring MD) Medicines:            Monitored Anesthesia Care Complications:        No immediate complications. Estimated blood loss: None. Procedure:            Pre-Anesthesia Assessment:                       - Prior to the procedure, a History and Physical was                        performed, and patient medications and allergies were                        reviewed. The patient is competent. The risks and                        benefits of the procedure and the sedation options and                        risks were discussed with the patient. All questions                        were answered and informed consent was obtained.                        Patient identification and proposed procedure were                        verified by the physician, the nurse, the                        anesthesiologist, the anesthetist and the technician in                        the pre-procedure area in the procedure room in the                        endoscopy suite. Mental Status Examination: alert and                        oriented. Airway Examination: normal oropharyngeal                        airway and neck mobility. Respiratory Examination:                        clear to auscultation. CV Examination: normal.                        Prophylactic Antibiotics: The patient does not require  prophylactic antibiotics. Prior Anticoagulants: The                        patient has taken no previous anticoagulant or                        antiplatelet  agents. ASA Grade Assessment: II - A                        patient with mild systemic disease. After reviewing the                        risks and benefits, the patient was deemed in                        satisfactory condition to undergo the procedure. The                        anesthesia plan was to use monitored anesthesia care                        (MAC). Immediately prior to administration of                        medications, the patient was re-assessed for adequacy                        to receive sedatives. The heart rate, respiratory rate,                        oxygen saturations, blood pressure, adequacy of                        pulmonary ventilation, and response to care were                        monitored throughout the procedure. The physical status                        of the patient was re-assessed after the procedure.                       After obtaining informed consent, the colonoscope was                        passed under direct vision. Throughout the procedure,                        the patient's blood pressure, pulse, and oxygen                        saturations were monitored continuously. The                        Colonoscope was introduced through the anus and                        advanced to the the cecum, identified by appendiceal  orifice and ileocecal valve. The Colonoscope was                        introduced through the anus and advanced to the the                        cecum, identified by appendiceal orifice and ileocecal                        valve. The colonoscopy was performed without                        difficulty. The patient tolerated the procedure well.                        The quality of the bowel preparation was evaluated                        using the BBPS [facility] Bowel Preparation Scale) with                        scores of: Right Colon = 2 (minor amount of residual                         staining, small fragments of stool and/or opaque                        liquid, but mucosa seen well), Transverse Colon = 2                        (minor amount of residual staining, small fragments of                        stool and/or opaque liquid, but mucosa seen well) and                        Left Colon = 2 (minor amount of residual staining,                        small fragments of stool and/or opaque liquid, but                        mucosa seen well). The total BBPS score equals 6. The                        quality of the bowel preparation was fair. Findings:      The perianal and digital rectal examinations were normal. Pertinent       negatives include normal sphincter tone and no palpable rectal lesions.      Normal mucosa was found in the entire colon. Biopsies for histology were       taken with a cold forceps from the entire colon for evaluation of       microscopic colitis.      The retroflexed view of the distal rectum and anal verge was normal and       showed no anal or rectal abnormalities.      A moderate amount of semi-liquid stool was found in the entire colon,  interfering with visualization. Lavage of the area was performed using       50 - 200 mL of sterile water, resulting in clearance with fair       visualization.      The terminal ileum appeared normal. Impression:           - Preparation of the colon was fair.                       - Normal mucosa in the entire examined colon. Biopsied.                       - The distal rectum and anal verge are normal on                        retroflexion view.                       - Stool in the entire examined colon. Recommendation:       - Discharge patient to a nursing home (with escort).                       - Resume previous diet today.                       - Continue present medications.                       - Await pathology results.                       - Return to my office as previously  scheduled. Procedure Code(s):    --- Professional ---                       [phone], Colonoscopy, flexible; with biopsy, single or                        multiple Diagnosis Code(s):    --- Professional ---                       K52.9, Noninfective gastroenteritis and colitis,                        unspecified CPT copyright 2018 American Medical Association. All rights reserved. The codes documented in this report are preliminary and upon coder review may  be revised to meet current compliance requirements. Dr. Libby Maw Toney Reil MD, MD 05/15/2018 9:44:00 AM This report has been signed electronically. Number of Addenda: 0 Note Initiated On: 05/15/2018 9:11 AM Scope Withdrawal Time: 0 hours 8 minutes 4 seconds  Total Procedure Duration: 0 hours 11 minutes 55 seconds       [facility]

## 2018-05-15 NOTE — Anesthesia Post-op Follow-up Note (Signed)
Anesthesia QCDR form completed.        

## 2018-05-15 NOTE — Anesthesia Postprocedure Evaluation (Signed)
Anesthesia Post Note  Patient: Briana SpiesLynda K Young  Procedure(s) Performed: COLONOSCOPY WITH PROPOFOL (N/A )  Patient location during evaluation: Endoscopy Anesthesia Type: General Level of consciousness: awake and alert Pain management: pain level controlled Vital Signs Assessment: post-procedure vital signs reviewed and stable Respiratory status: spontaneous breathing and respiratory function stable Cardiovascular status: stable Anesthetic complications: no     Last Vitals:  Vitals:   05/15/18 0839 05/15/18 0944  BP: 110/74 (!) 83/55  Pulse: 100 75  Resp: 18 17  Temp: (!) 36.2 C (!) 36.1 C  SpO2: 100% 98%    Last Pain:  Vitals:   05/15/18 0944  TempSrc: Tympanic  PainSc: Asleep                 Levana Minetti K

## 2018-05-15 NOTE — Anesthesia Preprocedure Evaluation (Signed)
Anesthesia Evaluation  Patient identified by MRN, date of birth, ID band Patient awake    Reviewed: Allergy & Precautions, NPO status , Patient's Chart, lab work & pertinent test results  History of Anesthesia Complications Negative for: history of anesthetic complications  Airway Mallampati: III       Dental  (+) Partial Lower, Partial Upper   Pulmonary neg sleep apnea, neg COPD,           Cardiovascular (-) hypertension(-) Past MI and (-) CHF + dysrhythmias (hx of afib) Atrial Fibrillation      Neuro/Psych neg Seizures Mental retardation   GI/Hepatic Neg liver ROS, GERD  Medicated and Controlled,  Endo/Other  diabetes, Type 2, Oral Hypoglycemic Agents  Renal/GU negative Renal ROS     Musculoskeletal   Abdominal   Peds  Hematology   Anesthesia Other Findings   Reproductive/Obstetrics                             Anesthesia Physical Anesthesia Plan  ASA: III  Anesthesia Plan: General   Post-op Pain Management:    Induction: Intravenous  PONV Risk Score and Plan: 3 and Propofol infusion, TIVA and Midazolam  Airway Management Planned: Nasal Cannula  Additional Equipment:   Intra-op Plan:   Post-operative Plan:   Informed Consent: I have reviewed the patients History and Physical, chart, labs and discussed the procedure including the risks, benefits and alternatives for the proposed anesthesia with the patient or authorized representative who has indicated his/her understanding and acceptance.     Plan Discussed with:   Anesthesia Plan Comments:         Anesthesia Quick Evaluation

## 2018-05-15 NOTE — Transfer of Care (Signed)
Immediate Anesthesia Transfer of Care Note  Patient: Briana Young  Procedure(s) Performed: COLONOSCOPY WITH PROPOFOL (N/A )  Patient Location: PACU and Endoscopy Unit  Anesthesia Type:General  Level of Consciousness: sedated  Airway & Oxygen Therapy: Patient Spontanous Breathing  Post-op Assessment: Report given to RN  Post vital signs: stable  Last Vitals:  Vitals Value Taken Time  BP 83/55 05/15/2018  9:44 AM  Temp 36.1 C 05/15/2018  9:44 AM  Pulse 75 05/15/2018  9:44 AM  Resp 17 05/15/2018  9:44 AM  SpO2 98 % 05/15/2018  9:44 AM    Last Pain:  Vitals:   05/15/18 0944  TempSrc: Tympanic  PainSc: Asleep         Complications: No apparent anesthesia complications

## 2018-05-15 NOTE — H&P (Signed)
Briana Repress, MD [address]  [address]  [place], [place] [address]  Main: [phone]  Fax: [phone] Pager: [phone]  Primary Care Physician:  Emogene Morgan, MD Primary Gastroenterologist:  Dr. Arlyss Young  Pre-Procedure History & Physical: HPI:  Briana Young is a 52 y.o. female is here for an colonoscopy.   Past Medical History:  Diagnosis Date  . Allergic rhinitis   . Arrhythmia 02/2011   A-fib  during admission for pancreatic mass excision with subsequent development of PE  . Diabetes mellitus without complication (HCC)    Pt takes Metformin  . Gingival hyperplasia   . History of atrial tachycardia    premature  . Hyperlipidemia   . Hypertension   . Mental retardation   . Obesity   . Pancreatic mass 01/2011   benign  . Pulmonary embolism (HCC) 2012   on coumadin    Past Surgical History:  Procedure Laterality Date  . 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 TONSILLECTOMY      Prior to Admission medications   Medication Sig Start Date End Date Taking? Authorizing Provider  acetaminophen (TYLENOL) 325 MG tablet Take 650 mg by mouth every 6 (six) hours as needed.      [provider]  cetirizine (ZYRTEC) 10 MG chewable tablet Chew 10 mg by mouth daily.    [provider]  fluticasone (FLONASE) 50 MCG/ACT nasal spray Place into both nostrils daily.    [provider]  glipiZIDE (GLUCOTROL) 5 MG tablet Take by mouth daily before breakfast.    [provider]  lisinopril (PRINIVIL,ZESTRIL) 2.5 MG tablet Take 2.5 mg by mouth daily.    [provider]  meloxicam (MOBIC) 15 MG tablet Take 1 tablet (15 mg total) by mouth daily as needed for pain. 04/03/18   Tommie Sams, DO  metoprolol (LOPRESSOR) 50 MG tablet Take 1 tablet (50 mg total) by mouth 2 (two) times daily. 03/29/11 03/28/12  Antonieta Iba, MD  omeprazole (PRILOSEC) 20 MG capsule Take 20 mg by mouth daily.      [provider]  PARoxetine  (PAXIL) 30 MG tablet Take 30 mg by mouth every morning.      [provider]  POLYETHYLENE GLYCOL 3350 PO Take by mouth.      [provider]    Allergies as of 04/25/2018  . (No Known Allergies)    Family History  Problem Relation Age of Onset  . Heart disease Father     Social History   Socioeconomic History  . Marital status: Single    Spouse name: Not on file  . Number of children: Not on file  . Years of education: Not on file  . Highest education level: Not on file  Occupational History  . Not on file  Social Needs  . Financial resource strain: Not on file  . Food insecurity:    Worry: Not on file    Inability: Not on file  . Transportation needs:    Medical: Not on file    Non-medical: Not on file  Tobacco Use  . Smoking status: Never Smoker  . Smokeless tobacco: Never Used  Substance and Sexual Activity  . Alcohol use: No  . Drug use: Never  . Sexual activity: Not on file  Lifestyle  . Physical activity:    Days per week: Not on file    Minutes per session: Not on file  . Stress: Not on file  Relationships  . Social connections:    Talks on phone: Not on file    Gets together: Not on file    Attends religious service: Not on file    Active member of club or organization: Not on file    Attends meetings of clubs or organizations: Not on file    Relationship status: Not on file  . Intimate partner violence:    Fear of current or ex partner: Not on file    Emotionally abused: Not on file    Physically abused: Not on file    Forced sexual activity: Not on file  Other Topics Concern  . Not on file  Social History Narrative  . Not on file    Review of Systems: See HPI, otherwise negative ROS  Physical Exam: BP 110/74   Pulse 100   Temp (!) 97.2 F (36.2 C) (Tympanic)   Resp 18   Ht 5\' 8"  (1.727 m)   Wt 72.6 kg   SpO2 100%   BMI 24.33 kg/m  General:   Alert,  pleasant and cooperative in NAD Head:  Normocephalic and  atraumatic. Neck:  Supple; no masses or thyromegaly. Lungs:  Clear throughout to auscultation.    Heart:  Regular rate and rhythm. Abdomen:  Soft, nontender and nondistended. Normal bowel sounds, without guarding, and without rebound.   Neurologic:  Alert and  oriented x4;  grossly normal neurologically.  Impression/Plan: Briana Young is here for an colonoscopy to be performed for chronic diarrhea  Risks, benefits, limitations, and alternatives regarding  colonoscopy have been reviewed with the patient.  Questions have been answered.  All parties agreeable.   Briana Donathohini Vanga, MD  05/15/2018, 9:19 AM

## 2018-05-16 LAB — SURGICAL PATHOLOGY

## 2018-06-13 ENCOUNTER — Telehealth: Payer: Self-pay | Admitting: Gastroenterology

## 2018-06-13 NOTE — Telephone Encounter (Signed)
Cadema is calling from [facility] to find out if pt needs to continue rx ZYMPET please call CB [phone]

## 2018-06-13 NOTE — Telephone Encounter (Signed)
Returned call to Cadema from [place] [facility] and medication has been D/C from medication list

## 2018-06-20 ENCOUNTER — Other Ambulatory Visit: Payer: Self-pay | Admitting: Family Medicine

## 2018-06-20 DIAGNOSIS — Z1231 Encounter for screening mammogram for malignant neoplasm of breast: Secondary | ICD-10-CM

## 2018-06-27 ENCOUNTER — Ambulatory Visit: Payer: Medicaid Other | Admitting: Gastroenterology

## 2018-07-08 ENCOUNTER — Ambulatory Visit
Admission: RE | Admit: 2018-07-08 | Discharge: 2018-07-08 | Disposition: A | Discharge status: Home / Self Care | Payer: Medicare Other | Source: Ambulatory Visit | Attending: Family Medicine | Admitting: Family Medicine

## 2018-07-08 DIAGNOSIS — Z1231 Encounter for screening mammogram for malignant neoplasm of breast: Secondary | ICD-10-CM | POA: Diagnosis present

## 2018-07-29 ENCOUNTER — Encounter: Payer: Self-pay | Admitting: Gastroenterology

## 2018-07-29 ENCOUNTER — Ambulatory Visit (INDEPENDENT_AMBULATORY_CARE_PROVIDER_SITE_OTHER): Payer: Medicare Other | Admitting: Gastroenterology

## 2018-07-29 ENCOUNTER — Telehealth: Payer: Self-pay | Admitting: Gastroenterology

## 2018-07-29 ENCOUNTER — Other Ambulatory Visit: Payer: Self-pay

## 2018-07-29 VITALS — BP 119/75 | HR 92 | Resp 17 | Wt 167.6 lb

## 2018-07-29 DIAGNOSIS — R197 Diarrhea, unspecified: Secondary | ICD-10-CM

## 2018-07-29 DIAGNOSIS — R140 Abdominal distension (gaseous): Secondary | ICD-10-CM | POA: Diagnosis not present

## 2018-07-29 DIAGNOSIS — K5904 Chronic idiopathic constipation: Secondary | ICD-10-CM

## 2018-07-29 MED ORDER — POLYETHYLENE GLYCOL 3350 17 GM/SCOOP PO POWD: 1.0000 | Freq: Every day | ORAL | 0 refills | Status: AC

## 2018-07-29 NOTE — Progress Notes (Signed)
Arlyss Repress, MD [address]  [address]  [place], [place] [address]  Main: [phone]  Fax: [phone]    Gastroenterology Consultation  Referring Provider:     Emogene Morgan, MD Primary Care Physician:  Emogene Morgan, MD Primary Gastroenterologist:  Dr. Arlyss Repress Reason for Consultation: Constipation        HPI:   Briana Young is a 53 y.o. female referred by Dr. Emogene Morgan, MD  for consultation & management of chronic diarrhea, abdominal bloating.  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 fib previously on anticoagulation.  She has been experiencing several months history of chronic nonbloody diarrhea, having bowel movements which are soft up to 5 times a day.  Denies nocturnal diarrhea.  She reports that she has to wipe several times after each BM as the stools are soft.  She reports abdominal bloating and mild upper abdominal discomfort.  She denies rectal bleeding.  She lost about 8 pounds in last 1 to 2 months.  Patient is a group home resident due to history of mental retardation.  Patient reports that she did not have a BM in the last 2 days  Follow-up visit 07/29/2018 Patient underwent colonoscopy which was unremarkable.  Patient today is accompanied by her healthcare power of attorney who is her aunt.  Her aunt reported that when patient was living with her, she did not have controlled on what she was eating and she was diabetic.  After she moved to group home, she has been eating low-fat low-carb diet that has resulted in weight loss.  When asked patient about diarrhea she said her stools are hard and has bowel movements about 3 times a day.  Patient is receiving MiraLAX and Colace as needed at group home. Patient had work-up including H. pylori antigen, pancreatic fecal elastase, celiac serologies which were unremarkable.  NSAIDs:  None  Antiplts/Anticoagulants/Anti thrombotics: None  GI Procedures: Reports having undergone an endoscopy and colonoscopy several years ago when she was staying with her mom  Colonoscopy 05/15/2018 - Preparation of the colon was fair. - Normal terminal ileum - Normal mucosa in the entire examined colon. Biopsied. - The distal rectum and anal verge are normal on retroflexion view. - Stool in the entire examined colon.  DIAGNOSIS:  A. COLON, RANDOM; BIOPSIES:  - NEGATIVE FOR ACTIVE MUCOSAL INFLAMMATION, GRANULOMAS,  MICROSCOPIC/LYMPHOCYTIC COLITIS, DYSPLASIA AND MALIGNANCY.    Past Medical History:  Diagnosis Date  . Allergic rhinitis   . Arrhythmia 02/2011   A-fib  during admission for pancreatic mass excision with subsequent development of PE  . Diabetes mellitus without complication (HCC)    Pt takes Metformin  . Gingival hyperplasia   . History of atrial tachycardia    premature  . Hyperlipidemia   . Hypertension   . Mental retardation   . Obesity   . Pancreatic mass 01/2011   benign  . Pulmonary embolism (HCC) 2012   on coumadin    Past Surgical History:  Procedure Laterality Date  . COLONOSCOPY WITH PROPOFOL N/A 05/15/2018   Procedure: COLONOSCOPY WITH PROPOFOL;  Surgeon: Toney Reil, MD;  Location: [facility] ENDOSCOPY;  Service: Gastroenterology;  Laterality: N/A;  . 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 TONSILLECTOMY      Current Outpatient Medications:  .  acetaminophen (TYLENOL) 325 MG tablet, Take 650 mg by mouth every 6 (  six) hours as needed.  , Disp: , Rfl:  .  atorvastatin (LIPITOR) 20 MG tablet, , Disp: , Rfl:  .  cetirizine (ZYRTEC) 10 MG chewable tablet, Chew 10 mg by mouth daily., Disp: , Rfl:  .  fluticasone (FLONASE) 50 MCG/ACT nasal spray, Place into both nostrils daily., Disp: , Rfl:  .  glipiZIDE (GLUCOTROL) 5 MG tablet, Take by mouth daily before breakfast., Disp: , Rfl:  .  lisinopril (PRINIVIL,ZESTRIL) 2.5 MG tablet, Take 2.5 mg by mouth daily.,  Disp: , Rfl:  .  meloxicam (MOBIC) 15 MG tablet, Take 1 tablet (15 mg total) by mouth daily as needed for pain., Disp: 14 tablet, Rfl: 0 .  metFORMIN (GLUCOPHAGE) 1000 MG tablet, , Disp: , Rfl:  .  NYAMYC powder, , Disp: , Rfl:  .  omeprazole (PRILOSEC) 20 MG capsule, Take 20 mg by mouth daily.  , Disp: , Rfl:  .  PARoxetine (PAXIL) 40 MG tablet, , Disp: , Rfl:  .  ZENPEP 40000-126000 units CPEP, , Disp: , Rfl:  .  metoprolol (LOPRESSOR) 50 MG tablet, Take 1 tablet (50 mg total) by mouth 2 (two) times daily., Disp: 60 tablet, Rfl: 6 .  PARoxetine (PAXIL) 30 MG tablet, Take 30 mg by mouth every morning.  , Disp: , Rfl:  .  polyethylene glycol powder (GLYCOLAX/MIRALAX) powder, Take 255 g by mouth daily for 30 days., Disp: 7650 g, Rfl: 0    Family History  Problem Relation Age of Onset  . Heart disease Father   . Breast cancer Neg Hx      Social History   Tobacco Use  . Smoking status: Never Smoker  . Smokeless tobacco: Never Used  Substance Use Topics  . Alcohol use: No  . Drug use: Never    Allergies as of 07/29/2018  . (No Known Allergies)    Review of Systems:    All systems reviewed and negative except where noted in HPI.   Physical Exam:  BP 119/75 (BP Location: Left Arm, Patient Position: Sitting, Cuff Size: Normal)   Pulse 92   Resp 17   Wt 167 lb 9.6 oz (76 kg)   BMI 25.48 kg/m  No LMP recorded. Patient has had a hysterectomy.  General:   Alert,  Well-developed, well-nourished, pleasant and cooperative in NAD Head:  Normocephalic and atraumatic. Eyes:  Sclera clear, no icterus.   Conjunctiva pink. Ears:  Normal auditory acuity. Nose:  No deformity, discharge, or lesions. Mouth:  No deformity or lesions,oropharynx pink & moist. Neck:  Supple; no masses or thyromegaly. Lungs:  Respirations even and unlabored.  Clear throughout to auscultation.   No wheezes, crackles, or rhonchi. No acute distress. Heart:  Regular rate and rhythm; no murmurs, clicks, rubs, or  gallops. Abdomen:  Normal bowel sounds.  Old scar in the left upper quadrant, soft, nontender and non-distended without masses, hepatosplenomegaly or hernias noted.  No guarding or rebound tenderness.   Rectal: Not performed Msk:  Symmetrical without gross deformities. Good, equal movement & strength bilaterally. Pulses:  Normal pulses noted. Extremities:  No clubbing or edema.  No cyanosis. Neurologic:  Alert and oriented x3;  grossly normal neurologically. Skin:  Intact without significant lesions or rashes. No jaundice. Psych:  Alert and cooperative. Normal mood and affect.  Imaging Studies: No recent abdominal imaging  Assessment and Plan:   Briana Young is a 53 y.o. 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  Her weight has been stable.  Colonoscopy was unremarkable. Pancreatic fecal elastase, H. pylori stool antigen, celiac serologies, CBC, CMP, CRP unremarkable CT abdomen revealed postsurgical changes consistent with previous distal pancreatectomy.  The pancreatic head and body were normal.  Her symptoms are probably secondary to chronic constipation and overflow diarrhea.  No further work-up is recommended at this time Recommend MiraLAX 17 g, once daily Recommend fiber supplements   Follow up as needed   Arlyss Repressohini R Vanga, MD

## 2018-07-29 NOTE — Telephone Encounter (Signed)
[facility] left vm, in regards to pt rx Miralax they ned clarification please call [phone]

## 2018-07-30 NOTE — Telephone Encounter (Signed)
Confirmed prescription with pharmacy

## 2019-06-22 ENCOUNTER — Other Ambulatory Visit: Payer: Self-pay | Admitting: Family Medicine

## 2019-06-22 DIAGNOSIS — Z1231 Encounter for screening mammogram for malignant neoplasm of breast: Secondary | ICD-10-CM

## 2019-07-13 ENCOUNTER — Other Ambulatory Visit: Payer: Self-pay

## 2019-07-13 ENCOUNTER — Ambulatory Visit
Admission: RE | Admit: 2019-07-13 | Discharge: 2019-07-13 | Disposition: A | Discharge status: Home / Self Care | Payer: Medicare Other | Source: Ambulatory Visit | Attending: Family Medicine | Admitting: Family Medicine

## 2019-07-13 DIAGNOSIS — Z1231 Encounter for screening mammogram for malignant neoplasm of breast: Secondary | ICD-10-CM | POA: Diagnosis present

## 2020-10-10 ENCOUNTER — Other Ambulatory Visit: Payer: Self-pay

## 2020-10-10 ENCOUNTER — Emergency Department: Payer: Medicare Other

## 2020-10-10 ENCOUNTER — Encounter: Payer: Self-pay | Admitting: *Deleted

## 2020-10-10 ENCOUNTER — Emergency Department
Admission: EM | Admit: 2020-10-10 | Discharge: 2020-10-10 | Disposition: A | Discharge status: Home / Self Care | Payer: Medicare Other | Attending: Emergency Medicine | Admitting: Emergency Medicine

## 2020-10-10 DIAGNOSIS — R000 Tachycardia, unspecified: Secondary | ICD-10-CM | POA: Diagnosis not present

## 2020-10-10 DIAGNOSIS — I10 Essential (primary) hypertension: Secondary | ICD-10-CM | POA: Insufficient documentation

## 2020-10-10 DIAGNOSIS — Z79899 Other long term (current) drug therapy: Secondary | ICD-10-CM | POA: Insufficient documentation

## 2020-10-10 DIAGNOSIS — R079 Chest pain, unspecified: Secondary | ICD-10-CM | POA: Diagnosis not present

## 2020-10-10 DIAGNOSIS — R002 Palpitations: Secondary | ICD-10-CM

## 2020-10-10 DIAGNOSIS — E1165 Type 2 diabetes mellitus with hyperglycemia: Secondary | ICD-10-CM | POA: Insufficient documentation

## 2020-10-10 DIAGNOSIS — Z7984 Long term (current) use of oral hypoglycemic drugs: Secondary | ICD-10-CM | POA: Insufficient documentation

## 2020-10-10 DIAGNOSIS — E860 Dehydration: Secondary | ICD-10-CM | POA: Diagnosis not present

## 2020-10-10 LAB — BASIC METABOLIC PANEL
Anion gap: 10 (ref 5–15)
BUN: 39 mg/dL — ABNORMAL HIGH (ref 6–20)
CO2: 23 mmol/L (ref 22–32)
Calcium: 9.2 mg/dL (ref 8.9–10.3)
Chloride: 100 mmol/L (ref 98–111)
Creatinine, Ser: 1.25 mg/dL — ABNORMAL HIGH (ref 0.44–1.00)
GFR, Estimated: 51 mL/min — ABNORMAL LOW (ref >60)
Glucose, Bld: 155 mg/dL — ABNORMAL HIGH (ref 70–99)
Potassium: 4 mmol/L (ref 3.5–5.1)
Sodium: 133 mmol/L — ABNORMAL LOW (ref 135–145)

## 2020-10-10 LAB — CBC
HCT: 35.9 % — ABNORMAL LOW (ref 36.0–46.0)
Hemoglobin: 12.2 g/dL (ref 12.0–15.0)
MCH: 29.3 pg (ref 26.0–34.0)
MCHC: 34 g/dL (ref 30.0–36.0)
MCV: 86.3 fL (ref 80.0–100.0)
Platelets: 432 10*3/uL — ABNORMAL HIGH (ref 150–400)
RBC: 4.16 MIL/uL (ref 3.87–5.11)
RDW: 13 % (ref 11.5–15.5)
WBC: 9 10*3/uL (ref 4.0–10.5)
nRBC: 0 % (ref 0.0–0.2)

## 2020-10-10 LAB — MAGNESIUM: Magnesium: 2 mg/dL (ref 1.7–2.4)

## 2020-10-10 LAB — TSH: TSH: 0.861 u[IU]/mL (ref 0.350–4.500)

## 2020-10-10 LAB — D-DIMER, QUANTITATIVE: D-Dimer, Quant: 0.33 ug/mL-FEU (ref 0.00–0.50)

## 2020-10-10 LAB — TROPONIN I (HIGH SENSITIVITY): Troponin I (High Sensitivity): 6 ng/L (ref <18)

## 2020-10-10 MED ORDER — LACTATED RINGERS IV BOLUS
1000.0000 mL | Freq: Once | INTRAVENOUS | Status: AC
  Administered 2020-10-10: 1000 mL via INTRAVENOUS

## 2020-10-10 MED ORDER — METOPROLOL TARTRATE 50 MG PO TABS
50.0000 mg | ORAL_TABLET | Freq: Two times a day (BID) | ORAL | Status: DC
  Administered 2020-10-10: 50 mg via ORAL
  Filled 2020-10-10: qty 1

## 2020-10-10 MED ORDER — METOPROLOL TARTRATE 50 MG PO TABS: 50.0000 mg | ORAL_TABLET | Freq: Once | ORAL | Status: DC

## 2020-10-10 NOTE — ED Notes (Signed)
Provided DC instructions. Verbalized understanding. DC with Legal Guardian. Alert and ambulatory.

## 2020-10-10 NOTE — ED Provider Notes (Signed)
[facility] Emergency Department Provider Note  ____________________________________________   Event Date/Time   First MD Initiated Contact with Patient 10/10/20 1928     (approximate)  I have reviewed the triage vital signs and the nursing notes.   HISTORY  Chief Complaint Palpitations   HPI Briana Young is a 55 y.o. female with a past medical history of HTN, HDL, cognitive impairment, PE and A. fib not currently anticoagulated on rate control this was associated with a pancreatic mass that was subsequently excised who presents accompanied by legal guardian for assessment of headache and some palpitations and chest pain that occurred earlier today.  Patient's heart rate was reportedly elevated and she was hypoxic at nursing home.  She states her headache began after she arrived to emergency room and her chest pain has subsided a little bit.  She is not sure when her chest pain began.  She denies any nausea, vomiting, diarrhea, Donnell pain, back pain, fevers, rash or recent injuries or falls.  States she is not currently short of breath.  No other acute concerns at this time.  She is not sure if she has had occipital headache or not.         Past Medical History:  Diagnosis Date  . Allergic rhinitis   . Arrhythmia 02/2011   A-fib  during admission for pancreatic mass excision with subsequent development of PE  . Diabetes mellitus without complication (HCC)    Pt takes Metformin  . Gingival hyperplasia   . History of atrial tachycardia    premature  . Hyperlipidemia   . Hypertension   . Mental retardation   . Obesity   . Pancreatic mass 01/2011   benign  . Pulmonary embolism (HCC) 2012   on coumadin    Patient Active Problem List   Diagnosis Date Noted  . Atrial fibrillation (HCC) 03/29/2011  . Hyperlipidemia 03/29/2011  . Pulmonary embolism (HCC) 03/29/2011  . Pancreatic disorder 03/29/2011    Past Surgical History:  Procedure Laterality  Date  . COLONOSCOPY WITH PROPOFOL N/A 05/15/2018   Procedure: COLONOSCOPY WITH PROPOFOL;  Surgeon: Toney Reil, MD;  Location: [facility] ENDOSCOPY;  Service: Gastroenterology;  Laterality: N/A;  . 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 TONSILLECTOMY      Prior to Admission medications   Medication Sig Start Date End Date Taking? Authorizing Provider  acetaminophen (TYLENOL) 325 MG tablet Take 650 mg by mouth every 6 (six) hours as needed.      [provider]  atorvastatin (LIPITOR) 20 MG tablet  06/11/18   [provider]  cetirizine (ZYRTEC) 10 MG chewable tablet Chew 10 mg by mouth daily.    [provider]  fluticasone (FLONASE) 50 MCG/ACT nasal spray Place into both nostrils daily.    [provider]  glipiZIDE (GLUCOTROL) 5 MG tablet Take by mouth daily before breakfast.    [provider]  lisinopril (PRINIVIL,ZESTRIL) 2.5 MG tablet Take 2.5 mg by mouth daily.    [provider]  meloxicam (MOBIC) 15 MG tablet Take 1 tablet (15 mg total) by mouth daily as needed for pain. 04/03/18   Tommie Sams, DO  metFORMIN (GLUCOPHAGE) 1000 MG tablet  06/11/18   [provider]  metoprolol (LOPRESSOR) 50 MG tablet Take 1 tablet (50 mg total) by mouth 2 (two) times daily. 03/29/11 03/28/12  Antonieta Iba, MD  [facility] powder  02/10/18   [provider]  omeprazole (PRILOSEC) 20 MG  capsule Take 20 mg by mouth daily.      [provider]  PARoxetine (PAXIL) 30 MG tablet Take 30 mg by mouth every morning.      [provider]  PARoxetine (PAXIL) 40 MG tablet  06/11/18   [provider]  ZENPEP [id number] units CPEP  05/13/18   [provider]    Allergies Patient has no known allergies.  Family History  Problem Relation Age of Onset  . Heart disease Father   . Breast cancer Neg Hx     Social History Social History   Tobacco Use  . Smoking status: Never Smoker  . Smokeless tobacco: Never  Used  Substance Use Topics  . Alcohol use: No  . Drug use: Never    Review of Systems  Review of Systems  Constitutional: Negative for chills and fever.  HENT: Negative for sore throat.   Eyes: Negative for pain.  Respiratory: Negative for cough and stridor.   Cardiovascular: Positive for chest pain.  Gastrointestinal: Negative for vomiting.  Genitourinary: Negative for dysuria.  Musculoskeletal: Negative for myalgias.  Skin: Negative for rash.  Neurological: Positive for headaches. Negative for seizures and loss of consciousness.  Psychiatric/Behavioral: Negative for suicidal ideas.  All other systems reviewed and are negative.     ____________________________________________   PHYSICAL EXAM:  VITAL SIGNS: ED Triage Vitals  Enc Vitals Group     BP 10/10/20 1830 112/76     Pulse Rate 10/10/20 1830 (!) 138     Resp 10/10/20 1830 18     Temp 10/10/20 1830 98.2 F (36.8 C)     Temp Source 10/10/20 1830 Oral     SpO2 10/10/20 1830 100 %     Weight 10/10/20 1832 137 lb 14.4 oz (62.6 kg)     Height 10/10/20 1832 5\' 9"  (1.753 m)     Head Circumference --      Peak Flow --      Pain Score 10/10/20 1831 9     Pain Loc --      Pain Edu? --      Excl. in GC? --    Vitals:   10/10/20 2012 10/10/20 2133  BP: 108/79 118/83  Pulse: (!) 119 (!) 110  Resp: 18 18  Temp: 98.2 F (36.8 C) 98.5 F (36.9 C)  SpO2: 97% 97%   Physical Exam Vitals and nursing note reviewed.  Constitutional:      General: She is not in acute distress.    Appearance: She is well-developed.  HENT:     Head: Normocephalic and atraumatic.     Right Ear: External ear normal.     Left Ear: External ear normal.     Nose: Nose normal.  Eyes:     Conjunctiva/sclera: Conjunctivae normal.  Cardiovascular:     Rate and Rhythm: Regular rhythm. Tachycardia present.     Heart sounds: No murmur heard.   Pulmonary:     Effort: Pulmonary effort is normal. No respiratory distress.     Breath sounds:  Normal breath sounds.  Abdominal:     Palpations: Abdomen is soft.     Tenderness: There is no abdominal tenderness.  Musculoskeletal:     Cervical back: Neck supple.  Skin:    General: Skin is warm and dry.     Capillary Refill: Capillary refill takes 2 to 3 seconds.  Neurological:     Mental Status: She is alert and oriented to person, place, and time.  Psychiatric:  Mood and Affect: Mood normal.      ____________________________________________   LABS (all labs ordered are listed, but only abnormal results are displayed)  Labs Reviewed  BASIC METABOLIC PANEL - Abnormal; Notable for the following components:      Result Value   Sodium 133 (*)    Glucose, Bld 155 (*)    BUN 39 (*)    Creatinine, Ser 1.25 (*)    GFR, Estimated 51 (*)    All other components within normal limits  CBC - Abnormal; Notable for the following components:   HCT 35.9 (*)    Platelets 432 (*)    All other components within normal limits  MAGNESIUM  TSH  D-DIMER, QUANTITATIVE  TROPONIN I (HIGH SENSITIVITY)  TROPONIN I (HIGH SENSITIVITY)   ____________________________________________  EKG  SVT with ventricular rate of 138, unremarkable QTc interval ____________________________________________  RADIOLOGY  ED MD interpretation: No focal consolidation, pneumothorax, overt edema, effusion or any other clear acute intrathoracic process.  Official radiology report(s): DG Chest 2 View  Result Date: 10/10/2020 CLINICAL DATA:  Chest pain, palpitations EXAM: CHEST - 2 VIEW COMPARISON:  02/01/2018 FINDINGS: The heart size and mediastinal contours are within normal limits. Both lungs are clear. The visualized skeletal structures are unremarkable. IMPRESSION: No acute abnormality of the lungs. Electronically Signed   By: Lauralyn PrimesAlex  Bibbey M.D.   On: 10/10/2020 19:15    ____________________________________________   PROCEDURES  Procedure(s) performed (including Critical  Care):  Procedures   ____________________________________________   INITIAL IMPRESSION / ASSESSMENT AND PLAN / ED COURSE      Patient presents with above to history exam for assessment of some chest tightness and little bit of a headache.  EMS was reported called out because the nurse found her SPO2 was in the 70s although with EMS it was 99% on arrival it is 100% on room air.  She is noted to be tachycardic with a heart rate of 138 with otherwise stable vital signs on room air.  No history of any recent falls or injuries while patient is not sure what medicine she is on she knows she has been taking the last corrected.  She does endorse decreased appetite.  No other acute concerns on arrival.  With regard to patient's headache differential includes SAH, infectious process, Bolick derangements, anemia, dehydration, thrombosis and temporal arteritis.  She describes it is very mild onset over several hours and she has no tenderness over the temporal areas or focal deficits or other findings to suggest deep space infection in the head or neck or temporal arteritis or acute hemorrhage.  Regarding her chest discomfort she describes it as onset over a couple of hours earlier today and much better now that she is emergency room.  ECG shows tachycardia and some nonspecific changes.  However given troponin of 6:00 greater than 3 hours after reported symptom onset Evalose patient for ACS or myocarditis.  Chest x-ray has no evidence of pneumonia pneumothorax or effusion edema or any other clear acute thoracic process.  CBC shows no leukocytosis or acute anemia.  BMP remarkable for evidence of some mild dehydration and hyperglycemia without evidence of acidosis or DKA.  Creatinine is 1.25 compared to 1.092 years ago.  Patient's BUN is also elevated at 39.  Magnesium and TSH are unremarkable.  D-dimer is less than 5 and not consistent with PE.  Patient given some IV fluids with concerns for some dehydration  given she appears dry on exam.  For home Lopressor.  She  is not taken this for the evening dose.  She had significant provement in her heart rate down to the 110s stated her headache had resolved on my reassessment.  Given improving heart rate with otherwise reassuring exam and work-up I think she is safe for discharge with plan for continued outpatient evaluation and follow-up.  Suspect symptoms possibly related to some dehydration and missing a dose of metoprolol.  Discharged stable condition.  Return precautions advised and discussed.         ____________________________________________   FINAL CLINICAL IMPRESSION(S) / ED DIAGNOSES  Final diagnoses:  Palpitations  Chest pain, unspecified type  Dehydration    Medications  metoprolol tartrate (LOPRESSOR) tablet 50 mg (50 mg Oral Given 10/10/20 2146)  lactated ringers bolus 1,000 mL (0 mLs Intravenous Stopped 10/10/20 2136)     ED Discharge Orders    None       Note:  This document was prepared using Dragon voice recognition software and may include unintentional dictation errors.   Gilles Chiquito, MD 10/10/20 2211

## 2020-10-10 NOTE — ED Triage Notes (Signed)
Pt comes via EMS from Group [facility] in [place]. Nurse reported that her O2 was in 70s and called EMS. EMS reports 99% RA and pt denies any complaints.  HR-120 214 CBG BP-110/90

## 2020-12-21 ENCOUNTER — Other Ambulatory Visit: Payer: Self-pay | Admitting: Family Medicine

## 2020-12-21 DIAGNOSIS — Z1231 Encounter for screening mammogram for malignant neoplasm of breast: Secondary | ICD-10-CM

## 2021-01-18 ENCOUNTER — Ambulatory Visit
Admission: RE | Admit: 2021-01-18 | Discharge: 2021-01-18 | Disposition: A | Discharge status: Home / Self Care | Payer: Medicare Other | Source: Ambulatory Visit | Attending: Family Medicine | Admitting: Family Medicine

## 2021-01-18 ENCOUNTER — Other Ambulatory Visit: Payer: Self-pay

## 2021-01-18 DIAGNOSIS — Z1231 Encounter for screening mammogram for malignant neoplasm of breast: Secondary | ICD-10-CM | POA: Insufficient documentation

## 2021-04-10 ENCOUNTER — Other Ambulatory Visit: Payer: Self-pay

## 2021-04-10 ENCOUNTER — Emergency Department
Admission: EM | Admit: 2021-04-10 | Discharge: 2021-04-10 | Disposition: A | Discharge status: Home / Self Care | Payer: Medicare Other | Attending: Emergency Medicine | Admitting: Emergency Medicine

## 2021-04-10 ENCOUNTER — Emergency Department: Payer: Medicare Other

## 2021-04-10 DIAGNOSIS — Y92009 Unspecified place in unspecified non-institutional (private) residence as the place of occurrence of the external cause: Secondary | ICD-10-CM | POA: Diagnosis not present

## 2021-04-10 DIAGNOSIS — I10 Essential (primary) hypertension: Secondary | ICD-10-CM | POA: Insufficient documentation

## 2021-04-10 DIAGNOSIS — Z79899 Other long term (current) drug therapy: Secondary | ICD-10-CM | POA: Diagnosis not present

## 2021-04-10 DIAGNOSIS — R42 Dizziness and giddiness: Secondary | ICD-10-CM | POA: Diagnosis not present

## 2021-04-10 DIAGNOSIS — Z7984 Long term (current) use of oral hypoglycemic drugs: Secondary | ICD-10-CM | POA: Diagnosis not present

## 2021-04-10 DIAGNOSIS — E119 Type 2 diabetes mellitus without complications: Secondary | ICD-10-CM | POA: Diagnosis not present

## 2021-04-10 DIAGNOSIS — W182XXA Fall in (into) shower or empty bathtub, initial encounter: Secondary | ICD-10-CM | POA: Insufficient documentation

## 2021-04-10 DIAGNOSIS — R000 Tachycardia, unspecified: Secondary | ICD-10-CM | POA: Insufficient documentation

## 2021-04-10 LAB — CBC WITH DIFFERENTIAL/PLATELET
Abs Immature Granulocytes: 0.03 10*3/uL (ref 0.00–0.07)
Basophils Absolute: 0.1 10*3/uL (ref 0.0–0.1)
Basophils Relative: 1 %
Eosinophils Absolute: 0 10*3/uL (ref 0.0–0.5)
Eosinophils Relative: 0 %
HCT: 38.1 % (ref 36.0–46.0)
Hemoglobin: 13.1 g/dL (ref 12.0–15.0)
Immature Granulocytes: 0 %
Lymphocytes Relative: 29 %
Lymphs Abs: 2.6 10*3/uL (ref 0.7–4.0)
MCH: 30.2 pg (ref 26.0–34.0)
MCHC: 34.4 g/dL (ref 30.0–36.0)
MCV: 87.8 fL (ref 80.0–100.0)
Monocytes Absolute: 0.6 10*3/uL (ref 0.1–1.0)
Monocytes Relative: 6 %
Neutro Abs: 5.5 10*3/uL (ref 1.7–7.7)
Neutrophils Relative %: 64 %
Platelets: 427 10*3/uL — ABNORMAL HIGH (ref 150–400)
RBC: 4.34 MIL/uL (ref 3.87–5.11)
RDW: 13.8 % (ref 11.5–15.5)
WBC: 8.7 10*3/uL (ref 4.0–10.5)
nRBC: 0 % (ref 0.0–0.2)

## 2021-04-10 LAB — BASIC METABOLIC PANEL
Anion gap: 11 (ref 5–15)
BUN: 24 mg/dL — ABNORMAL HIGH (ref 6–20)
CO2: 22 mmol/L (ref 22–32)
Calcium: 9.1 mg/dL (ref 8.9–10.3)
Chloride: 104 mmol/L (ref 98–111)
Creatinine, Ser: 1.49 mg/dL — ABNORMAL HIGH (ref 0.44–1.00)
GFR, Estimated: 41 mL/min — ABNORMAL LOW (ref >60)
Glucose, Bld: 143 mg/dL — ABNORMAL HIGH (ref 70–99)
Potassium: 4.2 mmol/L (ref 3.5–5.1)
Sodium: 137 mmol/L (ref 135–145)

## 2021-04-10 LAB — URINALYSIS, ROUTINE W REFLEX MICROSCOPIC
Bilirubin Urine: NEGATIVE
Glucose, UA: NEGATIVE mg/dL
Hgb urine dipstick: NEGATIVE
Ketones, ur: NEGATIVE mg/dL
Leukocytes,Ua: NEGATIVE
Nitrite: NEGATIVE
Protein, ur: NEGATIVE mg/dL
Specific Gravity, Urine: 1.008 (ref 1.005–1.030)
pH: 6 (ref 5.0–8.0)

## 2021-04-10 LAB — HEPATIC FUNCTION PANEL
ALT: 16 U/L (ref 0–44)
AST: 20 U/L (ref 15–41)
Albumin: 3.9 g/dL (ref 3.5–5.0)
Alkaline Phosphatase: 66 U/L (ref 38–126)
Bilirubin, Direct: <0.1 mg/dL (ref 0.0–0.2)
Total Bilirubin: 0.7 mg/dL (ref 0.3–1.2)
Total Protein: 7.2 g/dL (ref 6.5–8.1)

## 2021-04-10 LAB — CBC
HCT: 37.5 % (ref 36.0–46.0)
Hemoglobin: 12.9 g/dL (ref 12.0–15.0)
MCH: 30.3 pg (ref 26.0–34.0)
MCHC: 34.4 g/dL (ref 30.0–36.0)
MCV: 88 fL (ref 80.0–100.0)
Platelets: 409 10*3/uL — ABNORMAL HIGH (ref 150–400)
RBC: 4.26 MIL/uL (ref 3.87–5.11)
RDW: 13.8 % (ref 11.5–15.5)
WBC: 9 10*3/uL (ref 4.0–10.5)
nRBC: 0 % (ref 0.0–0.2)

## 2021-04-10 LAB — LACTIC ACID, PLASMA
Lactic Acid, Venous: 2.8 mmol/L (ref 0.5–1.9)
Lactic Acid, Venous: 1.9 mmol/L (ref 0.5–1.9)

## 2021-04-10 LAB — TROPONIN I (HIGH SENSITIVITY)
Troponin I (High Sensitivity): 9 ng/L (ref <18)
Troponin I (High Sensitivity): 34 ng/L — ABNORMAL HIGH (ref <18)
Troponin I (High Sensitivity): 88 ng/L — ABNORMAL HIGH (ref <18)

## 2021-04-10 LAB — CBG MONITORING, ED: Glucose-Capillary: 110 mg/dL — ABNORMAL HIGH (ref 70–99)

## 2021-04-10 MED ORDER — SODIUM CHLORIDE 0.9 % IV BOLUS
1000.0000 mL | Freq: Once | INTRAVENOUS | Status: AC
  Administered 2021-04-10: 1000 mL via INTRAVENOUS

## 2021-04-10 MED ORDER — METOPROLOL TARTRATE 25 MG PO TABS: 25.0000 mg | ORAL_TABLET | Freq: Two times a day (BID) | ORAL | 11 refills | Status: DC

## 2021-04-10 MED ORDER — METOPROLOL TARTRATE 5 MG/5ML IV SOLN
2.5000 mg | Freq: Once | INTRAVENOUS | Status: AC
  Administered 2021-04-10: 2.5 mg via INTRAVENOUS
  Filled 2021-04-10: qty 5

## 2021-04-10 MED ORDER — METOPROLOL TARTRATE 5 MG/5ML IV SOLN
2.5000 mg | Freq: Once | INTRAVENOUS | Status: AC
  Administered 2021-04-10: 2.5 mg via INTRAVENOUS

## 2021-04-10 MED ORDER — SODIUM CHLORIDE 0.9 % IV BOLUS
500.0000 mL | Freq: Once | INTRAVENOUS | Status: AC
  Administered 2021-04-10: 500 mL via INTRAVENOUS

## 2021-04-10 NOTE — ED Notes (Signed)
Dr. Darnelle Catalan notified via secure chat of critical lactic acid 2.8.

## 2021-04-10 NOTE — ED Notes (Signed)
Pt transported to Xray via stretcher at this time.  ?

## 2021-04-10 NOTE — ED Triage Notes (Signed)
See first nurse note, pt reports being dizzy in shower this am causing her to fall. States she passed out twice, unsure if hit head. Denies pain. Answers orientation questions appropriately. Unsure of blood thinner use

## 2021-04-10 NOTE — Discharge Instructions (Addendum)
Please follow-up with Dr. Windell [facility] office. Give them a call in the morning let them know you had what appeared to be supraventricular tachycardia that was converted in the emergency department were told to follow-up with him.  Please return if you have any further symptoms.  This would include lightheadedness or dizziness.  Do not wait and see what happens if the symptoms come back she is return to the emergency department.  Take the metoprolol 25 mg 1 twice a day.  That should help with the heart to stay slower.

## 2021-04-10 NOTE — ED Provider Notes (Signed)
[facility] Emergency Department Provider Note   ____________________________________________   Event Date/Time   First MD Initiated Contact with Patient 04/10/21 [phone]     (approximate)  I have reviewed the triage vital signs and the nursing notes.   HISTORY  Chief Complaint Fall    HPI Briana Young is a 55 y.o. female patient with past history of A. fib with RVR but A. fib reportedly resolved after pancreatic mass was removed.  Patient was in the shower today and got lightheaded several times and fell.  She felt like she was going to pass out.  She still feeling lightheaded now.  She denies any headache.  She did not hit anything or hurt anything when she fell she says.  She has no neck pain or chest pain or tightness.  She is not short of breath she is not coughing she has no belly pain hip pain etc.  She does not remember having any fever.  Review of the old records show she used to take Lopressor for her rapid heart rate.         Past Medical History:  Diagnosis Date   Allergic rhinitis    Arrhythmia 02/2011   A-fib  during admission for pancreatic mass excision with subsequent development of PE   Diabetes mellitus without complication (HCC)    Pt takes Metformin   Gingival hyperplasia    History of atrial tachycardia    premature   Hyperlipidemia    Hypertension    Mental retardation    Obesity    Pancreatic mass 01/2011   benign   Pulmonary embolism (HCC) 2012   on coumadin    Patient Active Problem List   Diagnosis Date Noted   Atrial fibrillation (HCC) 03/29/2011   Hyperlipidemia 03/29/2011   Pulmonary embolism (HCC) 03/29/2011   Pancreatic disorder 03/29/2011    Past Surgical History:  Procedure Laterality Date   COLONOSCOPY WITH PROPOFOL N/A 05/15/2018   Procedure: COLONOSCOPY WITH PROPOFOL;  Surgeon: Toney Reil, MD;  Location: [facility] ENDOSCOPY;  Service: Gastroenterology;  Laterality: N/A;   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 Authorizing Provider  acetaminophen (TYLENOL) 325 MG tablet Take 650 mg by mouth every 6 (six) hours as needed.      [provider]  atorvastatin (LIPITOR) 20 MG tablet  06/11/18   [provider]  cetirizine (ZYRTEC) 10 MG chewable tablet Chew 10 mg by mouth daily.    [provider]  fluticasone (FLONASE) 50 MCG/ACT nasal spray Place into both nostrils daily.    [provider]  glipiZIDE (GLUCOTROL) 5 MG tablet Take by mouth daily before breakfast.    [provider]  lisinopril (PRINIVIL,ZESTRIL) 2.5 MG tablet Take 2.5 mg by mouth daily.    [provider]  meloxicam (MOBIC) 15 MG tablet Take 1 tablet (15 mg total) by mouth daily as needed for pain. 04/03/18   Tommie Sams, DO  metFORMIN (GLUCOPHAGE) 1000 MG tablet  06/11/18   [provider]  metoprolol (LOPRESSOR) 50 MG tablet Take 1 tablet (50 mg total) by mouth 2 (two) times daily. 03/29/11 03/28/12  Antonieta Iba, MD  [facility] powder  02/10/18   [provider]  omeprazole (PRILOSEC) 20 MG capsule Take 20 mg by mouth daily.      [provider]  PARoxetine (PAXIL) 30  MG tablet Take 30 mg by mouth every morning.      [provider]  PARoxetine (PAXIL) 40 MG tablet  06/11/18   [provider]  ZENPEP [id number] units CPEP  05/13/18   [provider]    Allergies Patient has no known allergies.  Family History  Problem Relation Age of Onset   Heart disease Father    Breast cancer Neg Hx     Social History Social History   Tobacco Use   Smoking status: Never   Smokeless tobacco: Never  Substance Use Topics   Alcohol use: No   Drug use: Never    Review of Systems  Constitutional: No fever/chills Eyes: No visual changes. ENT: No sore throat. Cardiovascular: Denies chest pain. Respiratory: Denies shortness of  breath. Gastrointestinal: No abdominal pain.  No nausea, no vomiting.  No diarrhea.  No constipation. Genitourinary: Negative for dysuria. Musculoskeletal: Negative for back pain. Skin: Negative for rash. Neurological: Negative for headaches, focal weakness   ____________________________________________   PHYSICAL EXAM:  VITAL SIGNS: ED Triage Vitals  Enc Vitals Group     BP 04/10/21 0717 (!) 99/57     Pulse Rate 04/10/21 0719 (!) 136     Resp 04/10/21 0717 20     Temp 04/10/21 0714 97.6 F (36.4 C)     Temp Source 04/10/21 0714 Oral     SpO2 04/10/21 0719 94 %     Weight 04/10/21 0714 140 lb (63.5 kg)     Height 04/10/21 0714 5\' 6"  (1.676 m)     Head Circumference --      Peak Flow --      Pain Score 04/10/21 0713 0     Pain Loc --      Pain Edu? --      Excl. in [place]? --     Constitutional: Alert and oriented. Well appearing and in no acute distress. Eyes: Conjunctivae are normal. PER Head: Atraumatic. Nose: No congestion/rhinnorhea. Mouth/Throat: Mucous membranes are moist.  Oropharynx non-erythematous. Neck: No stridor.  No cervical spine tenderness to palpation. Cardiovascular: Rapid rate, regular rhythm. Grossly normal heart sounds.  Good peripheral circulation. Respiratory: Normal respiratory effort.  No retractions. Lungs CTAB. Gastrointestinal: Soft and nontender. No distention. No abdominal bruits. No CVA tenderness. Musculoskeletal: No lower extremity tenderness nor edema.   Neurologic:  Normal speech and language. No gross focal neurologic deficits are appreciated.  Skin:  Skin is warm, dry and intact. No rash noted.   ____________________________________________   LABS (all labs ordered are listed, but only abnormal results are displayed)  Labs Reviewed  BASIC METABOLIC PANEL - Abnormal; Notable for the following components:      Result Value   Glucose, Bld 143 (*)    BUN 24 (*)    Creatinine, Ser 1.49 (*)    GFR, Estimated 41 (*)    All other  components within normal limits  CBC - Abnormal; Notable for the following components:   Platelets 409 (*)    All other components within normal limits  URINALYSIS, ROUTINE W REFLEX MICROSCOPIC - Abnormal; Notable for the following components:   Color, Urine STRAW (*)    APPearance CLEAR (*)    All other components within normal limits  LACTIC ACID, PLASMA - Abnormal; Notable for the following components:   Lactic Acid, Venous 2.8 (*)    All other components within normal limits  CBC WITH DIFFERENTIAL/PLATELET - Abnormal; Notable for the following components:   Platelets 427 (*)  All other components within normal limits  CBG MONITORING, ED - Abnormal; Notable for the following components:   Glucose-Capillary 110 (*)    All other components within normal limits  TROPONIN I (HIGH SENSITIVITY) - Abnormal; Notable for the following components:   Troponin I (High Sensitivity) 34 (*)    All other components within normal limits  TROPONIN I (HIGH SENSITIVITY) - Abnormal; Notable for the following components:   Troponin I (High Sensitivity) 88 (*)    All other components within normal limits  LACTIC ACID, PLASMA  HEPATIC FUNCTION PANEL  DIFFERENTIAL  TROPONIN I (HIGH SENSITIVITY)  TROPONIN I (HIGH SENSITIVITY)   ____________________________________________  EKG  EKG read interpreted by me shows junctional rhythm with a rate of 136 narrow QRS at this time.  Left axis P wave is behind the QRS complex.  No acute ST-T wave changes _________________________________ EKG #2 read interpreted by me shows normal sinus rhythm rate of 82 normal axis normal EKG ___________  RADIOLOGY Gertha Calkin, personally viewed and evaluated these images (plain radiographs) as part of my medical decision making, as well as reviewing the written report by the radiologist.  ED MD interpretation:    Official radiology report(s): DG Chest 2 View  Result Date: 04/10/2021 CLINICAL DATA:  Syncope and  dizziness. EXAM: CHEST - 2 VIEW COMPARISON:  10/10/2020 FINDINGS: The heart size and mediastinal contours are within normal limits. Minimal bibasilar atelectasis. There is no evidence of pulmonary edema, consolidation, pneumothorax, nodule or pleural fluid. The visualized skeletal structures are unremarkable. IMPRESSION: No active cardiopulmonary disease. Electronically Signed   By: Aletta Edouard M.D.   On: 04/10/2021 08:12   CT HEAD WO CONTRAST  Result Date: 04/10/2021 CLINICAL DATA:  Unwitnessed fall and syncope EXAM: CT HEAD WITHOUT CONTRAST TECHNIQUE: Contiguous axial images were obtained from the base of the skull through the vertex without intravenous contrast. COMPARISON:  None. FINDINGS: Brain: No evidence of acute infarction, hemorrhage, hydrocephalus, extra-axial collection or mass lesion/mass effect. Linear infarct in the right cerebellum that appears discrete and very low-density on reformats, chronic appearing Vascular: No hyperdense vessel or unexpected calcification. Skull: Normal. Negative for fracture or focal lesion. Sinuses/Orbits: No acute finding. IMPRESSION: 1. No acute finding. 2. Small remote right cerebellar infarction. Electronically Signed   By: Jorje Guild M.D.   On: 04/10/2021 07:41    ____________________________________________   PROCEDURES  Procedure(s) performed (including Critical Care):  Procedures   ____________________________________________   INITIAL IMPRESSION / ASSESSMENT AND PLAN / ED COURSE   Discussed the patient with Dr. Saunders Revel on-call for cardiology they will follow her up in the office.  He recommended we try some adenosine or more metoprolol but patient converted before that could happen.  Patient is stable feels well heart rates down dizziness is gone.  I will have him follow-up in the office.  She will return for any further problems.  Because she is now back to normal I will not give her the metoprolol I was going to.          ____________________________________________   FINAL CLINICAL IMPRESSION(S) / ED DIAGNOSES  Final diagnoses:  [place]     ED Discharge Orders     None        Note:  This document was prepared using Dragon voice recognition software and may include unintentional dictation errors.    Nena Polio, MD 04/10/21 [phone]

## 2021-04-10 NOTE — ED Notes (Signed)
Dr. Darnelle Catalan notified face-to-face of critical troponin 88.

## 2021-04-10 NOTE — ED Triage Notes (Signed)
Arrives via [place], from [employer] group home, c/o an unwitnessed fall/ syncopal episode in the shower.  C/O dizziness and weakness.    Initial BP 63/40 -- 800 cc NS  99/52 P: 140  20g IV LFA CBG:  191

## 2021-04-11 ENCOUNTER — Telehealth: Payer: Self-pay | Admitting: Internal Medicine

## 2021-04-11 NOTE — Telephone Encounter (Signed)
-----   Message from Yvonne Kendall, MD sent at 04/10/2021  3:51 PM EDT ----- Regarding: New Consult following ED visit Hello,  Could you schedule Briana Young to be seen by any available provider at her earliest convenience for follow-up of SVT?  Thanks.  Thayer Ohm

## 2021-04-11 NOTE — Telephone Encounter (Signed)
Lvm to schedule appt.

## 2021-05-05 NOTE — Progress Notes (Signed)
New Outpatient Visit Date: 05/08/2021  Referring Provider: Lorra Hals, MD [facility] Emergency Department  Chief Complaint: Follow-up emergency department visit  HPI:  Briana Young is a 55 y.o. female who is being seen today for the evaluation of atrial fibrillation at the request of Dr. Cinda Quest. She has a history of paroxysmal atrial fibrillation that occurred in the setting of PE following pancreatic mass excision in 2012, hyperlipidemia, and type 2 diabetes mellitus.  She presented to the [facility] emergency department on 04/10/2021 after becoming lightheaded and falling while in the shower.  She was noted to be in a narrow complex tachycardia initially thought to be atrial fibrillation.  She was given metoprolol with conversion to sinus rhythm.  Personal review of the tracing suggest this was actually supraventricular tachycardia.  Today, Briana Young reports that she is feeling well.  She has not had any further episodes like what brought her to the ED on 10/31.  That morning, she reports suddenly becoming lightheaded and feeling like she would pass out in the shower.  Persisted until she came to the emergency department.  She has never felt anything like that before, though she notes an episode of syncope around the time of her pancreatic mass sensation and PE in 2012.  She has not had any chest pain, shortness of breath, or palpitations.  She has been tolerating metoprolol tartrate 25 mg twice daily without side effects.  --------------------------------------------------------------------------------------------------  Cardiovascular History & Procedures: Cardiovascular Problems: Paroxysmal atrial fibrillation (postop) Supraventricular tachycardia  Risk Factors: Hyperlipidemia and type 2 diabetes mellitus,  Cath/PCI: None  CV Surgery: None  EP Procedures and Devices: None  Non-Invasive Evaluation(s): None  Recent CV Pertinent Labs: Lab Results  Component Value Date   K 4.2  04/10/2021   MG 2.0 10/10/2020   BUN 24 (H) 04/10/2021   BUN 22 04/28/2018   CREATININE 1.49 (H) 04/10/2021    --------------------------------------------------------------------------------------------------  Past Medical History:  Diagnosis Date   Allergic rhinitis    Arrhythmia 02/2011   A-fib  during admission for pancreatic mass excision with subsequent development of PE   Diabetes mellitus without complication ([place])    Pt takes Metformin   Gingival hyperplasia    History of atrial tachycardia    premature   Hyperlipidemia    Hypertension    Mental retardation    Obesity    Pancreatic mass 01/2011   benign   Pulmonary embolism ([place]) 2012   on coumadin    Past Surgical History:  Procedure Laterality Date   COLONOSCOPY WITH PROPOFOL N/A 05/15/2018   Procedure: COLONOSCOPY WITH PROPOFOL;  Surgeon: Lin Landsman, MD;  Location: [facility] ENDOSCOPY;  Service: Gastroenterology;  Laterality: N/A;   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   aspirin EC 81 MG tablet Take 81 mg by mouth daily. Swallow whole.   atorvastatin (LIPITOR) 20 MG tablet Take 20 mg by mouth daily.   cetirizine (ZYRTEC) 10 MG chewable tablet Chew 10 mg by mouth daily.   clotrimazole (LOTRIMIN) 1 % cream Apply 1 application topically 2 (two) times daily.   fluticasone (FLONASE) 50 MCG/ACT nasal spray Place 2 sprays into both nostrils daily.   hydrocortisone cream 1 % Apply 1 application topically 2 (two) times daily as needed for itching.   lisinopril (PRINIVIL,ZESTRIL) 2.5 MG tablet Take 2.5 mg  by mouth daily.   metFORMIN (GLUCOPHAGE) 500 MG tablet Take 500 mg by mouth daily with breakfast.   Multiple Vitamin (MULTIVITAMIN) capsule Take 1 capsule by mouth daily.   NYAMYC powder Apply 1 application topically once a week.   olopatadine (PATADAY) 0.1 % ophthalmic solution  Place 1 drop into both eyes daily as needed for allergies.   omeprazole (PRILOSEC) 20 MG capsule Take 40 mg by mouth daily.   PARoxetine (PAXIL) 40 MG tablet    polyethylene glycol (MIRALAX / GLYCOLAX) 17 g packet Take 17 g by mouth daily as needed.   [DISCONTINUED] metoprolol tartrate (LOPRESSOR) 25 MG tablet Take 25 mg by mouth 2 (two) times daily.    Allergies: Patient has no known allergies.  Social History   Tobacco Use   Smoking status: Never   Smokeless tobacco: Never  Substance Use Topics   Alcohol use: No   Drug use: Never    Family History  Problem Relation Age of Onset   Heart disease Father    Breast cancer Neg Hx     Review of Systems: A 12-system review of systems was performed and was negative except as noted in the HPI.  --------------------------------------------------------------------------------------------------  Physical Exam: BP 110/70 (BP Location: Left Arm, Patient Position: Sitting, Cuff Size: Normal)   Pulse (!) 59   Wt 146 lb (66.2 kg)   SpO2 98%   BMI 23.57 kg/m   General: NAD.  Accompanied by staff member from her group home. HEENT: No conjunctival pallor or scleral icterus. Facemask in place. Neck: Supple without lymphadenopathy, thyromegaly, JVD, or HJR. No carotid bruit. Lungs: Normal work of breathing. Clear to auscultation bilaterally without wheezes or crackles. Heart: Regular rate and rhythm without murmurs, rubs, or gallops. Non-displaced PMI. Abd: Bowel sounds present. Soft, NT/ND without hepatosplenomegaly Ext: No lower extremity edema. Radial, PT, and DP pulses are 2+ bilaterally Skin: Warm and dry without rash. Neuro: CNIII-XII intact. Strength and fine-touch sensation intact in upper and lower extremities bilaterally. Psych: Normal mood and flat affect.  EKG: Sinus bradycardia (heart rate 59 bpm).  Otherwise, no significant abnormality.  Lab Results  Component Value Date   WBC 9.0 04/10/2021   WBC 8.7 04/10/2021   HGB  12.9 04/10/2021   HGB 13.1 04/10/2021   HCT 37.5 04/10/2021   HCT 38.1 04/10/2021   MCV 88.0 04/10/2021   MCV 87.8 04/10/2021   PLT 409 (H) 04/10/2021   PLT 427 (H) 04/10/2021    Lab Results  Component Value Date   NA 137 04/10/2021   K 4.2 04/10/2021   CL 104 04/10/2021   CO2 22 04/10/2021   BUN 24 (H) 04/10/2021   CREATININE 1.49 (H) 04/10/2021   GLUCOSE 143 (H) 04/10/2021   ALT 16 04/10/2021    No results found for: CHOL, HDL, LDLCALC, LDLDIRECT, TRIG, CHOLHDL   --------------------------------------------------------------------------------------------------  ASSESSMENT AND PLAN: Supraventricular tachycardia: ED visit on 04/10/2021 most likely precipitated by narrow complex tachycardia most consistent with SVT.  Since being on low-dose metoprolol, she has not had any recurrence.  EKG today shows sinus bradycardia with a resting heart rate of 59 bpm.  I think it would be reasonable to obtain an echocardiogram to exclude structural abnormalities underlying her SVT and near syncope.  We will plan to continue with low-dose metoprolol.  If she has recurrent PSVT, we will consider referral to EP.  Paroxysmal atrial fibrillation: Noted around the time of pulmonary embolism following pancreatic mass excision in 2012.  Given no evidence of  recurrence of atrial fibrillation in the setting of a CHA2DS2-VASc score of 3 (gender, hypertension, and diabetes mellitus), we will continue to defer anticoagulation.  It may be useful to perform ambulatory cardiac monitoring in the future to exclude silent PAF, however.  Hypertension: Blood pressure well controlled today.  Continue current doses of metoprolol and lisinopril.  Type 2 diabetes mellitus: Ongoing management per PCP.  Follow-up: Return to clinic in 3 months.  Yvonne Kendall, MD 05/08/2021 10:19 AM

## 2021-05-08 ENCOUNTER — Encounter: Payer: Self-pay | Admitting: Internal Medicine

## 2021-05-08 ENCOUNTER — Other Ambulatory Visit: Payer: Self-pay

## 2021-05-08 ENCOUNTER — Ambulatory Visit (INDEPENDENT_AMBULATORY_CARE_PROVIDER_SITE_OTHER): Payer: Medicare Other | Admitting: Internal Medicine

## 2021-05-08 VITALS — BP 110/70 | HR 59 | Wt 146.0 lb

## 2021-05-08 DIAGNOSIS — R55 Syncope and collapse: Secondary | ICD-10-CM

## 2021-05-08 DIAGNOSIS — I10 Essential (primary) hypertension: Secondary | ICD-10-CM

## 2021-05-08 DIAGNOSIS — I480 Paroxysmal atrial fibrillation: Secondary | ICD-10-CM | POA: Diagnosis not present

## 2021-05-08 DIAGNOSIS — I471 Supraventricular tachycardia: Secondary | ICD-10-CM | POA: Diagnosis not present

## 2021-05-08 MED ORDER — METOPROLOL TARTRATE 25 MG PO TABS: 25.0000 mg | ORAL_TABLET | Freq: Two times a day (BID) | ORAL | 1 refills | Status: DC

## 2021-05-08 NOTE — Patient Instructions (Signed)
Medication Instructions:   Your physician recommends that you continue on your current medications as directed. Please refer to the Current Medication list given to you today.  *If you need a refill on your cardiac medications before your next appointment, please call your pharmacy*   Lab Work:  None ordered  Testing/Procedures:  Your physician has requested that you have an echocardiogram. Echocardiography is a painless test that uses sound waves to create images of your heart. It provides your doctor with information about the size and shape of your heart and how well your heart's chambers and valves are working. This procedure takes approximately one hour. There are no restrictions for this procedure.   Follow-Up: At [facility], you and your health needs are our priority.  As part of our continuing mission to provide you with exceptional heart care, we have created designated Provider Care Teams.  These Care Teams include your primary Cardiologist (physician) and Advanced Practice Providers (APPs -  Physician Assistants and Nurse Practitioners) who all work together to provide you with the care you need, when you need it.  We recommend signing up for the patient portal called "MyChart".  Sign up information is provided on this After Visit Summary.  MyChart is used to connect with patients for Virtual Visits (Telemedicine).  Patients are able to view lab/test results, encounter notes, upcoming appointments, etc.  Non-urgent messages can be sent to your provider as well.   To learn more about what you can do with MyChart, go to [email or web address].    Your next appointment:    3 month(s) w/ APP  The format for your next appointment:   In Person  Provider:   You will see one of the following Advanced Practice Providers on your designated Care Team:   Nicolasa Ducking, NP Eula Listen, PA-C Cadence Fransico Michael, [place]

## 2021-05-09 ENCOUNTER — Ambulatory Visit (INDEPENDENT_AMBULATORY_CARE_PROVIDER_SITE_OTHER): Payer: Medicare Other

## 2021-05-09 DIAGNOSIS — R55 Syncope and collapse: Secondary | ICD-10-CM | POA: Diagnosis not present

## 2021-05-09 DIAGNOSIS — I471 Supraventricular tachycardia: Secondary | ICD-10-CM | POA: Diagnosis not present

## 2021-05-09 LAB — ECHOCARDIOGRAM COMPLETE
AR max vel: 2.92 cm2
AV Area VTI: 2.88 cm2
AV Area mean vel: 2.95 cm2
AV Mean grad: 4 mmHg
AV Peak grad: 8.1 mmHg
Ao pk vel: 1.42 m/s
Area-P 1/2: 3.81 cm2
Calc EF: 53.9 %
P 1/2 time: 851 msec
S' Lateral: 2.6 cm
Single Plane A2C EF: 47.5 %
Single Plane A4C EF: 58.3 %

## 2021-05-10 ENCOUNTER — Telehealth: Payer: Self-pay | Admitting: *Deleted

## 2021-05-10 NOTE — Telephone Encounter (Signed)
-----   Message from Yvonne Kendall, MD sent at 05/10/2021 10:52 AM EST ----- Please let Ms. Geurts know that her echocardiogram shows that her heart is contracting well without any significant abnormalities to explain her recent episode of supraventricular tachycardia.  I recommend that she continue her current medications and follow-up as previously arranged.

## 2021-05-10 NOTE — Telephone Encounter (Signed)
Attempted to call pt. Pt not available at this time. Will call back at later time.

## 2021-05-12 NOTE — Telephone Encounter (Signed)
Attempted to call pt's legal guardian, Gareth Morgan. No answer. Lmtcb.

## 2021-05-16 NOTE — Telephone Encounter (Signed)
Spoke with [employer], pt's legal guardian.  Notified of echo results and Dr. Serita Kyle recc.  Briana Young voiced understanding and has no further questions at this time.

## 2021-08-06 ENCOUNTER — Other Ambulatory Visit: Payer: Self-pay

## 2021-08-06 ENCOUNTER — Ambulatory Visit
Admission: EM | Admit: 2021-08-06 | Discharge: 2021-08-06 | Disposition: A | Discharge status: Home / Self Care | Payer: Medicare Other

## 2021-08-06 DIAGNOSIS — H6593 Unspecified nonsuppurative otitis media, bilateral: Secondary | ICD-10-CM | POA: Diagnosis not present

## 2021-08-06 MED ORDER — IPRATROPIUM BROMIDE 0.03 % NA SOLN: 2.0000 | Freq: Two times a day (BID) | NASAL | 1 refills | Status: DC

## 2021-08-06 NOTE — ED Provider Notes (Signed)
[facility]    CSN: [id number] Arrival date & time: 08/06/21  0930      History   Chief Complaint Chief Complaint  Patient presents with   Otalgia    HPI Briana Young is a 56 y.o. female with complaint of bilateral ear pressure with tinnitus.  History allergies, diabetes, pulmonary embolism, mental retardation.  Here today with caregiver who helps provide history.  Denies dizziness, hearing changes.  Denies recent illness, denies cough, congestion, fever/chills.  Allergies are only moderately well controlled with Allegra daily.  Also using Flonase daily.  HPI  Past Medical History:  Diagnosis Date   Allergic rhinitis    Arrhythmia 02/2011   A-fib  during admission for pancreatic mass excision with subsequent development of PE   Diabetes mellitus without complication (HCC)    Pt takes Metformin   Gingival hyperplasia    History of atrial tachycardia    premature   Hyperlipidemia    Hypertension    Mental retardation    Obesity    Pancreatic mass 01/2011   benign   Pulmonary embolism (HCC) 2012   on coumadin    Patient Active Problem List   Diagnosis Date Noted   Atrial fibrillation (HCC) 03/29/2011   Hyperlipidemia 03/29/2011   Pulmonary embolism (HCC) 03/29/2011   Pancreatic disorder 03/29/2011    Past Surgical History:  Procedure Laterality Date   COLONOSCOPY WITH PROPOFOL N/A 05/15/2018   Procedure: COLONOSCOPY WITH PROPOFOL;  Surgeon: Toney Reil, MD;  Location: [facility] ENDOSCOPY;  Service: Gastroenterology;  Laterality: N/A;   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      Home Medications    Prior to Admission medications   Medication Sig Start Date End Date Taking? Authorizing Provider  acetaminophen (TYLENOL) 500 MG tablet Take 500 mg by mouth every 6 (six) hours as needed.   Yes [provider]  aspirin EC 81 MG tablet Take 81 mg by mouth daily. Swallow whole.   Yes  [provider]  atorvastatin (LIPITOR) 20 MG tablet Take 20 mg by mouth daily. 06/11/18  Yes [provider]  cetirizine (ZYRTEC) 10 MG chewable tablet Chew 10 mg by mouth daily.   Yes [provider]  clotrimazole (LOTRIMIN) 1 % cream Apply 1 application topically 2 (two) times daily.   Yes [provider]  fexofenadine (ALLEGRA) 180 MG tablet Take 180 mg by mouth daily.   Yes [provider]  fluticasone (FLONASE) 50 MCG/ACT nasal spray Place 2 sprays into both nostrils daily.   Yes [provider]  ipratropium (ATROVENT) 0.03 % nasal spray Place 2 sprays into both nostrils every 12 (twelve) hours. 08/06/21  Yes Rhys Martini, PA-C  lisinopril (PRINIVIL,ZESTRIL) 2.5 MG tablet Take 2.5 mg by mouth daily.   Yes [provider]  metFORMIN (GLUCOPHAGE) 500 MG tablet Take 500 mg by mouth daily with breakfast.   Yes [provider]  metoprolol tartrate (LOPRESSOR) 25 MG tablet Take 1 tablet (25 mg total) by mouth 2 (two) times daily. 05/08/21 11/04/21 Yes End, Cristal Deer, MD  Multiple Vitamin (MULTIVITAMIN) capsule Take 1 capsule by mouth daily.   Yes [provider]  omeprazole (PRILOSEC) 20 MG capsule Take 40 mg by mouth daily.   Yes [provider]  PARoxetine (PAXIL) 40 MG tablet  06/11/18  Yes [provider]  hydrocortisone cream 1 % Apply 1 application  topically 2 (two) times daily as needed for itching.    [provider]  [facility] powder Apply 1 application topically once a week. 02/10/18   [provider]  olopatadine (PATADAY) 0.1 % ophthalmic solution Place 1 drop into both eyes daily as needed for allergies.    [provider]  polyethylene glycol (MIRALAX / GLYCOLAX) 17 g packet Take 17 g by mouth daily as needed.    [provider]    Family History Family History  Problem Relation Age of Onset   Heart disease Father    Breast cancer Neg Hx     Social  History Social History   Tobacco Use   Smoking status: Never   Smokeless tobacco: Never  Substance Use Topics   Alcohol use: No   Drug use: Never     Allergies   Patient has no known allergies.   Review of Systems Review of Systems  Constitutional:  Negative for appetite change, chills and fever.  HENT:  Positive for ear pain. Negative for congestion, rhinorrhea, sinus pressure, sinus pain and sore throat.   Eyes:  Negative for redness and visual disturbance.  Respiratory:  Negative for cough, chest tightness, shortness of breath and wheezing.   Cardiovascular:  Negative for chest pain and palpitations.  Gastrointestinal:  Negative for abdominal pain, constipation, diarrhea, nausea and vomiting.  Genitourinary:  Negative for dysuria, frequency and urgency.  Musculoskeletal:  Negative for myalgias.  Neurological:  Negative for dizziness, weakness and headaches.  Psychiatric/Behavioral:  Negative for confusion.   All other systems reviewed and are negative.   Physical Exam Triage Vital Signs ED Triage Vitals  Enc Vitals Group     BP 08/06/21 0939 131/63     Pulse Rate 08/06/21 0939 64     Resp 08/06/21 0939 18     Temp 08/06/21 0939 97.9 F (36.6 C)     Temp Source 08/06/21 0939 Oral     SpO2 08/06/21 0939 96 %     Weight 08/06/21 0936 145 lb 15.1 oz (66.2 kg)     Height --      Head Circumference --      Peak Flow --      Pain Score 08/06/21 0936 0     Pain Loc --      Pain Edu? --      Excl. in GC? --    No data found.  Updated Vital Signs BP 131/63 (BP Location: Left Arm)    Pulse 64    Temp 97.9 F (36.6 C) (Oral)    Resp 18    Wt 145 lb 15.1 oz (66.2 kg)    SpO2 96%    BMI 23.56 kg/m   Visual Acuity Right Eye Distance:   Left Eye Distance:   Bilateral Distance:    Right Eye Near:   Left Eye Near:    Bilateral Near:     Physical Exam Vitals reviewed.  Constitutional:      Appearance: Normal appearance. She is not ill-appearing.  HENT:      Head: Normocephalic and atraumatic.     Right Ear: Hearing, ear canal and external ear normal. No swelling or tenderness. A middle ear effusion is present. There is no impacted cerumen. No mastoid tenderness. Tympanic membrane is not injected, scarred, perforated, erythematous, retracted or bulging.     Left Ear: Hearing, ear canal and external ear normal. No swelling or tenderness. A middle ear effusion is present. There is no impacted cerumen.  No mastoid tenderness. Tympanic membrane is not injected, scarred, perforated, erythematous, retracted or bulging.     Mouth/Throat:     Pharynx: Oropharynx is clear. No oropharyngeal exudate or posterior oropharyngeal erythema.  Cardiovascular:     Rate and Rhythm: Normal rate and regular rhythm.     Heart sounds: Normal heart sounds.  Pulmonary:     Effort: Pulmonary effort is normal.     Breath sounds: Normal breath sounds.  Lymphadenopathy:     Cervical: No cervical adenopathy.  Neurological:     General: No focal deficit present.     Mental Status: She is alert and oriented to person, place, and time.  Psychiatric:        Mood and Affect: Mood normal.        Behavior: Behavior normal.        Thought Content: Thought content normal.        Judgment: Judgment normal.     UC Treatments / Results  Labs (all labs ordered are listed, but only abnormal results are displayed) Labs Reviewed - No data to display  EKG   Radiology No results found.  Procedures Procedures (including critical care time)  Medications Ordered in UC Medications - No data to display  Initial Impression / Assessment and Plan / UC Course  I have reviewed the triage vital signs and the nursing notes.  Pertinent labs & imaging results that were available during my care of the patient were reviewed by me and considered in my medical decision making (see chart for details).     This patient is a very pleasant 55 y.o. year old female presenting with mid ear  effusion related to allergic rhinitis. Afebrile, nontachy. No recent URI. Already taking allegra and flonase daily. Will add atrovent nasal spray for about 1 week. F/u with ENT if symptoms persist. ED return precautions discussed. Caregiver verbalizes understanding and agreement.    Final Clinical Impressions(s) / UC Diagnoses   Final diagnoses:  Middle ear effusion, bilateral     Discharge Instructions      -Continue flonase and allergra -Start the Atrovent nasal spray 1-2x daily for about 3-4 days. Can continue for 1 week if symptoms persist -Follow-up with Ear Nose and Throat doctor if symptoms persist    ED Prescriptions     Medication Sig Dispense Auth. Provider   ipratropium (ATROVENT) 0.03 % nasal spray Place 2 sprays into both nostrils every 12 (twelve) hours. 30 mL Rhys Martini, PA-C      PDMP not reviewed this encounter.   Rhys Martini, PA-C 08/06/21 1037

## 2021-08-06 NOTE — ED Triage Notes (Signed)
Patient is here for "Ear Pain" (ringing, making funny noises). Started Friday. No fever. No other symptoms.

## 2021-08-06 NOTE — Discharge Instructions (Addendum)
-  Continue flonase and allergra -Start the Atrovent nasal spray 1-2x daily for about 3-4 days. Can continue for 1 week if symptoms persist -Follow-up with Ear Nose and Throat doctor if symptoms persist

## 2021-08-09 ENCOUNTER — Ambulatory Visit (INDEPENDENT_AMBULATORY_CARE_PROVIDER_SITE_OTHER): Payer: Medicare Other | Admitting: Nurse Practitioner

## 2021-08-09 ENCOUNTER — Encounter: Payer: Self-pay | Admitting: Nurse Practitioner

## 2021-08-09 ENCOUNTER — Other Ambulatory Visit: Payer: Self-pay

## 2021-08-09 VITALS — BP 100/60 | HR 66 | Wt 146.0 lb

## 2021-08-09 DIAGNOSIS — I480 Paroxysmal atrial fibrillation: Secondary | ICD-10-CM

## 2021-08-09 DIAGNOSIS — E782 Mixed hyperlipidemia: Secondary | ICD-10-CM | POA: Diagnosis not present

## 2021-08-09 DIAGNOSIS — E119 Type 2 diabetes mellitus without complications: Secondary | ICD-10-CM

## 2021-08-09 DIAGNOSIS — I471 Supraventricular tachycardia: Secondary | ICD-10-CM | POA: Diagnosis not present

## 2021-08-09 DIAGNOSIS — I10 Essential (primary) hypertension: Secondary | ICD-10-CM

## 2021-08-09 NOTE — Progress Notes (Signed)
Office Visit    Patient Name: Briana Young Date of Encounter: 08/09/2021  Primary Care Provider:  Donnie Coffin, MD Primary Cardiologist:  Briana Bush, MD  Chief Complaint    56 year old female with a history of paroxysmal atrial fibrillation, hypertension, hyperlipidemia, type 2 diabetes mellitus, obesity, benign pancreatic mass excision, pulmonary embolism, and developmental delays, who presents for follow-up related to PSVT.  Past Medical History    Past Medical History:  Diagnosis Date   Allergic rhinitis    Diabetes mellitus without complication ([place])    Pt takes Metformin   Diastolic dysfunction    a. 04/2021 Echo: EF 55-60%, no rwma, GrI DD, Nl RV fxn, RVSP 19.52mmHg. Mild MR/AI.   Gingival hyperplasia    Hyperlipidemia    Hypertension    Mental retardation    Obesity    PAF (paroxysmal atrial fibrillation) ([place])    a. 02/2011 Developed AF in the setting of PE following pancreatic mass excision.  No known recurrence -->CHA2DS2VASc = 3.   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   PSVT (paroxysmal supraventricular tachycardia) ([place])    a. 03/2021 Seen in ED for PSVT.   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  She developed paroxysmal atrial fibrillation in the setting of pulmonary embolism following pancreatic mass excision in 2012.  She was initially  treated with warfarin but without recurrent A-fib, it appears that this was discontinued after she completed usual course following a PE.  In late October, she was seen in the [facility] emergency department after developing presyncope while showering.  She was found to be in a narrow complex tachycardia which was initially thought to be atrial fibrillation.  She was treated with metoprolol with conversion to sinus rhythm.  She was referred to Dr. Saunders Revel, who evaluated her on November 28, at which time she had not had any additional tachycardia/presyncope.  Review of ECG from the emergency department in October, revealed supraventricular tachycardia.  Low-dose metoprolol therapy was continued and a 2D echocardiogram was carried out, and showed normal LV function with grade 1 diastolic dysfunction, and mild mitral regurgitation and aortic insufficiency.  Since her last visit, [name] has done well from a cardiac standpoint.  She denies chest pain, dyspnea, palpitations, PND, orthopnea, dizziness, syncope, edema, or early satiety.  She tries to stay active, joking that she "walks in circles" all day.  She was recently seen in the emergency department on February 26, with bilateral ear pressure and tinnitus.  She was noted to have bilateral middle ear effusions, felt to be consistent with allergic rhinitis she was advised to continue taking Allegra and Flonase.  Atrovent nasal spray was added.  At this point, she continues to note fullness in her right ear and plans to follow-up with ENT.  Home Medications    Current Outpatient Medications  Medication Sig Dispense Refill   acetaminophen (TYLENOL) 500 MG tablet Take  500 mg by mouth every 6 (six) hours as needed.     aspirin EC 81 MG tablet Take 81 mg by mouth daily. Swallow whole.     atorvastatin (LIPITOR) 20 MG tablet Take 20 mg by mouth daily.     cetirizine (ZYRTEC) 10 MG chewable tablet Chew 10 mg by mouth daily.     clotrimazole (LOTRIMIN) 1 % cream Apply 1  application topically 2 (two) times daily.     fexofenadine (ALLEGRA) 180 MG tablet Take 180 mg by mouth daily.     fluticasone (FLONASE) 50 MCG/ACT nasal spray Place 2 sprays into both nostrils daily.     hydrocortisone cream 1 % Apply 1 application topically 2 (two) times daily as needed for itching.     ipratropium (ATROVENT) 0.03 % nasal spray Place 2 sprays into both nostrils every 12 (twelve) hours. 30 mL 1   lisinopril (PRINIVIL,ZESTRIL) 2.5 MG tablet Take 2.5 mg by mouth daily.     metFORMIN (GLUCOPHAGE) 500 MG tablet Take 500 mg by mouth daily with breakfast.     metoprolol tartrate (LOPRESSOR) 25 MG tablet Take 1 tablet (25 mg total) by mouth 2 (two) times daily. 180 tablet 1   Multiple Vitamin (MULTIVITAMIN) capsule Take 1 capsule by mouth daily.     NYAMYC powder Apply 1 application topically once a week.     olopatadine (PATANOL) 0.1 % ophthalmic solution Place 1 drop into both eyes daily as needed for allergies.     omeprazole (PRILOSEC) 20 MG capsule Take 40 mg by mouth daily.     PARoxetine (PAXIL) 40 MG tablet      polyethylene glycol (MIRALAX / GLYCOLAX) 17 g packet Take 17 g by mouth daily as needed.     No current facility-administered medications for this visit.     Review of Systems    Some ongoing fullness in her right ear after recent finding of bilateral middle ear effusions.  She denies chest pain, palpitations, dyspnea, pnd, orthopnea, n, v, dizziness, syncope, edema, weight gain, or early satiety.  All other systems reviewed and are otherwise negative except as noted above.    Physical Exam    VS:  BP 100/60 (BP Location: Left Arm, Patient Position: Sitting, Cuff Size: Normal)    Pulse 66    Wt 146 lb (66.2 kg)    SpO2 91%    BMI 23.57 kg/m  , BMI Body mass index is 23.57 kg/m.     GEN: Well nourished, well developed, in no acute distress. HEENT: normal. Neck: Supple, no JVD, carotid bruits, or masses. Cardiac: RRR, no murmurs, rubs, or gallops. No  clubbing, cyanosis, edema.  Radials 2+ and equal bilaterally.  Respiratory:  Respirations regular and unlabored, clear to auscultation bilaterally. GI: Soft, nontender, nondistended, BS + x 4. MS: no deformity or atrophy. Skin: warm and dry, no rash. Neuro:  Strength and sensation are intact. Psych: Normal affect.  Accessory Clinical Findings    ECG personally reviewed by me today -regular sinus rhythm, 66 - no acute changes.  Lab Results  Component Value Date   WBC 9.0 04/10/2021   WBC 8.7 04/10/2021   HGB 12.9 04/10/2021   HGB 13.1 04/10/2021   HCT 37.5 04/10/2021   HCT 38.1 04/10/2021   MCV 88.0 04/10/2021   MCV 87.8 04/10/2021   PLT 409 (H) 04/10/2021   PLT 427 (H) 04/10/2021   Lab Results  Component Value Date   CREATININE 1.49 (H) 04/10/2021   BUN 24 (H)  04/10/2021   NA 137 04/10/2021   K 4.2 04/10/2021   CL 104 04/10/2021   CO2 22 04/10/2021   Lab Results  Component Value Date   ALT 16 04/10/2021   AST 20 04/10/2021   ALKPHOS 66 04/10/2021   BILITOT 0.7 04/10/2021   Assessment & Plan    1.  PSVT: Patient seen in the emergency department in late October with narrow complex tachycardia most consistent with SVT.  She has been managed with beta-blocker therapy without any recurrence.  Blood pressure is soft but stable, and she is asymptomatic.  Continue metoprolol 25 mg twice daily.  2.  Paroxysmal atrial fibrillation: This occurred in the setting of pulmonary embolism following pancreatic mass excision in 2012.  CHA2DS2-VASc equals 3.  In the setting of known prior provocation and absence of recurrent symptoms, she remains on aspirin therapy only.  3.  Essential hypertension: Blood pressure soft but stable.  She is on beta-blocker and ACE inhibitor therapy.  4.  Type 2 diabetes mellitus: Managed by primary care.  She is on metformin, statin, and ACE inhibitor therapy.  5.  Hyperlipidemia: This is followed by her primary care provider.  She remains on Lipitor 20  mg daily LFTs were normal October.  6.  Bilateral middle ear effusions: Recently seen in the emergency department secondary to fullness and tinnitus in her ears.  Felt to be secondary to allergic rhinitis with recommendation for ongoing fexofenadine and Flonase usage.  She now also has nasal Atrovent.  Still with some fullness in her right ear.  Plans to follow-up with ENT.    7.  Disposition: Follow-up in clinic in 6 months or sooner if necessary.   Murray Hodgkins, NP 08/09/2021, 10:58 AM

## 2021-08-09 NOTE — Patient Instructions (Signed)
Medication Instructions:  ° °Your physician recommends that you continue on your current medications as directed. Please refer to the Current Medication list given to you today.  ° °*If you need a refill on your cardiac medications before your next appointment, please call your pharmacy* ° ° °Lab Work: °None ordered ° °If you have labs (blood work) drawn today and your tests are completely normal, you will receive your results only by: °MyChart Message (if you have MyChart) OR °A paper copy in the mail °If you have any lab test that is abnormal or we need to change your treatment, we will call you to review the results. ° ° °Testing/Procedures: °None ordered ° ° °Follow-Up: °At [facility], you and your health needs are our priority.  As part of our continuing mission to provide you with exceptional heart care, we have created designated Provider Care Teams.  These Care Teams include your primary Cardiologist (physician) and Advanced Practice Providers (APPs -  Physician Assistants and Nurse Practitioners) who all work together to provide you with the care you need, when you need it. ° °We recommend signing up for the patient portal called "MyChart".  Sign up information is provided on this After Visit Summary.  MyChart is used to connect with patients for Virtual Visits (Telemedicine).  Patients are able to view lab/test results, encounter notes, upcoming appointments, etc.  Non-urgent messages can be sent to your provider as well.   °To learn more about what you can do with MyChart, go to [email or web address].   ° °Your next appointment:   °6 month(s) ° °The format for your next appointment:   °In Person ° °Provider:   °You may see Christopher End, MD or one of the following Advanced Practice Providers on your designated Care Team:   °Christopher Berge, NP °Ryan Dunn, PA-C °Cadence Furth, PA-C ° ° °Other Instructions °N/A °

## 2021-09-12 ENCOUNTER — Ambulatory Visit (INDEPENDENT_AMBULATORY_CARE_PROVIDER_SITE_OTHER): Payer: Medicare Other | Admitting: Podiatry

## 2021-09-12 DIAGNOSIS — Q828 Other specified congenital malformations of skin: Secondary | ICD-10-CM | POA: Diagnosis not present

## 2021-09-12 DIAGNOSIS — M216X2 Other acquired deformities of left foot: Secondary | ICD-10-CM | POA: Diagnosis not present

## 2021-09-12 NOTE — Progress Notes (Signed)
?Subjective:  ?Patient ID: Briana Young, female    DOB: [birth year],  MRN: [id number] ? ?Chief Complaint  ?Patient presents with  ? Callouses  ? ? ?56 y.o. female presents with the above complaint.  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  Patient states painful to touch painful to walk on has progressive gotten worse.  She would like to get this evaluated.  She has not seen anyone else prior to seeing me.  Hurts with ambulation and hurts with taking step.  She wears [employer].  Pain scale 7 out of 10. ? ? ?Review of Systems: Negative except as noted in the HPI. Denies N/V/F/Ch. ? ?Past Medical History:  ?Diagnosis Date  ? Allergic rhinitis   ? Diabetes mellitus without complication (HCC)   ? Pt takes Metformin  ? Diastolic dysfunction   ? a. 04/2021 Echo: EF 55-60%, no rwma, GrI DD, Nl RV fxn, RVSP 19.27mmHg. Mild MR/AI.  ? Gingival hyperplasia   ? Hyperlipidemia   ? Hypertension   ? Mental retardation   ? Obesity   ? PAF (paroxysmal atrial fibrillation) (HCC)   ? a. 02/2011 Developed AF in the setting of PE following pancreatic mass excision.  No known recurrence -->CHA2DS2VASc = 3.  ? Pancreatic mass   ? a. 01/2011 benign mass-->s/p excision.  ? PSVT (paroxysmal supraventricular tachycardia) (HCC)   ? a. 03/2021 Seen in ED for PSVT.  ? Pulmonary embolism (HCC) 2012  ? on coumadin  ? ? ?Current Outpatient Medications:  ?  acetaminophen (TYLENOL) 500 MG tablet, Take 500 mg by mouth every 6 (six) hours as needed., Disp: , Rfl:  ?  aspirin EC 81 MG tablet, Take 81 mg by mouth daily. Swallow whole., Disp: , Rfl:  ?  atorvastatin (LIPITOR) 20 MG tablet, Take 20 mg by mouth daily., Disp: , Rfl:  ?  cetirizine (ZYRTEC) 10 MG chewable tablet, Chew 10 mg by mouth daily., Disp: , Rfl:  ?  clotrimazole (LOTRIMIN) 1 % cream, Apply 1 application topically 2 (two) times daily., Disp: , Rfl:  ?  fexofenadine (ALLEGRA) 180 MG tablet, Take 180 mg by mouth daily., Disp: , Rfl:  ?   fluticasone (FLONASE) 50 MCG/ACT nasal spray, Place 2 sprays into both nostrils daily., Disp: , Rfl:  ?  hydrocortisone cream 1 %, Apply 1 application topically 2 (two) times daily as needed for itching., Disp: , Rfl:  ?  ipratropium (ATROVENT) 0.03 % nasal spray, Place 2 sprays into both nostrils every 12 (twelve) hours., Disp: 30 mL, Rfl: 1 ?  lisinopril (PRINIVIL,ZESTRIL) 2.5 MG tablet, Take 2.5 mg by mouth daily., Disp: , Rfl:  ?  metFORMIN (GLUCOPHAGE) 500 MG tablet, Take 500 mg by mouth daily with breakfast., Disp: , Rfl:  ?  metoprolol tartrate (LOPRESSOR) 25 MG tablet, Take 1 tablet (25 mg total) by mouth 2 (two) times daily., Disp: 180 tablet, Rfl: 1 ?  Multiple Vitamin (MULTIVITAMIN) capsule, Take 1 capsule by mouth daily., Disp: , Rfl:  ?  NYAMYC powder, Apply 1 application topically once a week., Disp: , Rfl:  ?  olopatadine (PATANOL) 0.1 % ophthalmic solution, Place 1 drop into both eyes daily as needed for allergies., Disp: , Rfl:  ?  omeprazole (PRILOSEC) 20 MG capsule, Take 40 mg by mouth daily., Disp: , Rfl:  ?  PARoxetine (PAXIL) 40 MG tablet, , Disp: , Rfl:  ?  polyethylene glycol (MIRALAX / GLYCOLAX) 17 g packet, Take 17 g by mouth daily as  needed., Disp: , Rfl:  ? ?Social History  ? ?Tobacco Use  ?Smoking Status Never  ?Smokeless Tobacco Never  ? ? ?No Known Allergies ?Objective:  ?There were no vitals filed for this visit. ?There is no height or weight on file to calculate BMI. ?Constitutional Well developed. ?Well nourished.  ?Vascular Dorsalis pedis pulses palpable bilaterally. ?Posterior tibial pulses palpable bilaterally. ?Capillary refill normal to all digits.  ?No cyanosis or clubbing noted. ?Pedal hair growth normal.  ?Neurologic Normal speech. ?Oriented to person, place, and time. ?Epicritic sensation to light touch grossly present bilaterally.  ?Dermatologic Hyperkeratotic lesion with central nucleated core noted to left submetatarsal 5.  Underlying plantarflexed fifth metatarsal  noted.  Pain on palpation to the lesion.  ?Orthopedic: Normal joint ROM without pain or crepitus bilaterally. ?No visible deformities. ?No bony tenderness.  ? ?Radiographs: None ?Assessment:  ? ?1. Porokeratosis   ?2. Plantar flexed metatarsal, left   ? ?Plan:  ?Patient was evaluated and treated and all questions answered. ? ?Left porokeratosis with underlying plantarflexed metatarsal ?-All questions and concerns were discussed with the patient in extensive detail.  Given the amount of pain that she is having I believe she will benefit from debridement of the lesion followed by excision of central nucleated core.  Using chisel blade handle the lesion was debrided down to healthy striated tissue.  No complication noted no pinpoint bleeding noted. ?-If there is no improvement we will discuss floating osteotomy during next clinical visit. ?-I discussed shoe gear modification and orthotics as well.  She states understanding. ?-She is diabetic with unknown A1c ? ?No follow-ups on file. ?

## 2021-10-31 ENCOUNTER — Ambulatory Visit (INDEPENDENT_AMBULATORY_CARE_PROVIDER_SITE_OTHER): Payer: Medicare Other

## 2021-10-31 ENCOUNTER — Ambulatory Visit (INDEPENDENT_AMBULATORY_CARE_PROVIDER_SITE_OTHER): Payer: Medicare Other | Admitting: Podiatry

## 2021-10-31 DIAGNOSIS — M216X2 Other acquired deformities of left foot: Secondary | ICD-10-CM

## 2021-10-31 DIAGNOSIS — Q828 Other specified congenital malformations of skin: Secondary | ICD-10-CM

## 2021-10-31 DIAGNOSIS — Z01818 Encounter for other preprocedural examination: Secondary | ICD-10-CM | POA: Diagnosis not present

## 2021-11-01 ENCOUNTER — Encounter: Payer: Self-pay | Admitting: Podiatry

## 2021-11-01 NOTE — Progress Notes (Signed)
Subjective:  Patient ID: Briana Young, female    DOB: 04/20/1966,  MRN: [id number]  Chief Complaint  Patient presents with   Callouses    56 y.o. female presents with the above complaint.  Patient presents with fall and left submetatarsal 5 porokeratosis/plantarflexed fifth metatarsal.  She states is too painful to touch is progressive gotten worse.  She states that she is on a diet controlled diabetic.  Her A1c is very well controlled.  She denies any other acute complaints she wanted to discuss treatment options including surgical options at this time.  She has failed all conservative treatment options including padding offloading debridements.   Review of Systems: Negative except as noted in the HPI. Denies N/V/F/Ch.  Past Medical History:  Diagnosis Date   Allergic rhinitis    Diabetes mellitus without complication (HCC)    Pt takes Metformin   Diastolic dysfunction    a. 04/2021 Echo: EF 55-60%, no rwma, GrI DD, Nl RV fxn, RVSP 19.82mmHg. Mild MR/AI.   Gingival hyperplasia    Hyperlipidemia    Hypertension    Mental retardation    Obesity    PAF (paroxysmal atrial fibrillation) (HCC)    a. 02/2011 Developed AF in the setting of PE following pancreatic mass excision.  No known recurrence -->CHA2DS2VASc = 3.   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   PSVT (paroxysmal supraventricular tachycardia) (HCC)    a. 03/2021 Seen in ED for PSVT.   Pulmonary embolism (HCC) 2012   on coumadin    Current Outpatient Medications:    acetaminophen (TYLENOL) 500 MG tablet, Take 500 mg by mouth every 6 (six) hours as needed., Disp: , Rfl:    aspirin EC 81 MG tablet, Take 81 mg by mouth daily. Swallow whole., Disp: , Rfl:    atorvastatin (LIPITOR) 20 MG tablet, Take 20 mg by mouth daily., Disp: , Rfl:    cetirizine (ZYRTEC) 10 MG chewable tablet, Chew 10 mg by mouth daily., Disp: , Rfl:    clotrimazole (LOTRIMIN) 1 % cream, Apply 1 application topically 2 (two) times daily.,  Disp: , Rfl:    fexofenadine (ALLEGRA) 180 MG tablet, Take 180 mg by mouth daily., Disp: , Rfl:    fluticasone (FLONASE) 50 MCG/ACT nasal spray, Place 2 sprays into both nostrils daily., Disp: , Rfl:    hydrocortisone cream 1 %, Apply 1 application topically 2 (two) times daily as needed for itching., Disp: , Rfl:    ipratropium (ATROVENT) 0.03 % nasal spray, Place 2 sprays into both nostrils every 12 (twelve) hours., Disp: 30 mL, Rfl: 1   lisinopril (PRINIVIL,ZESTRIL) 2.5 MG tablet, Take 2.5 mg by mouth daily., Disp: , Rfl:    metFORMIN (GLUCOPHAGE) 500 MG tablet, Take 500 mg by mouth daily with breakfast., Disp: , Rfl:    metoprolol tartrate (LOPRESSOR) 25 MG tablet, Take 1 tablet (25 mg total) by mouth 2 (two) times daily., Disp: 180 tablet, Rfl: 1   Multiple Vitamin (MULTIVITAMIN) capsule, Take 1 capsule by mouth daily., Disp: , Rfl:    NYAMYC powder, Apply 1 application topically once a week., Disp: , Rfl:    olopatadine (PATANOL) 0.1 % ophthalmic solution, Place 1 drop into both eyes daily as needed for allergies., Disp: , Rfl:    omeprazole (PRILOSEC) 20 MG capsule, Take 40 mg by mouth daily., Disp: , Rfl:    PARoxetine (PAXIL) 40 MG tablet, , Disp: , Rfl:    polyethylene glycol (MIRALAX / GLYCOLAX) 17 g  packet, Take 17 g by mouth daily as needed., Disp: , Rfl:   Social History   Tobacco Use  Smoking Status Never  Smokeless Tobacco Never    No Known Allergies Objective:  There were no vitals filed for this visit. There is no height or weight on file to calculate BMI. Constitutional Well developed. Well nourished.  Vascular Dorsalis pedis pulses palpable bilaterally. Posterior tibial pulses palpable bilaterally. Capillary refill normal to all digits.  No cyanosis or clubbing noted. Pedal hair growth normal.  Neurologic Normal speech. Oriented to person, place, and time. Epicritic sensation to light touch grossly present bilaterally.  Dermatologic Hyperkeratotic lesion with  central nucleated core noted to left submetatarsal 5.  Underlying plantarflexed fifth metatarsal noted.  Pain on palpation to the lesion.  Orthopedic: Normal joint ROM without pain or crepitus bilaterally. No visible deformities. No bony tenderness.   Radiographs: None Assessment:   1. Porokeratosis   2. Encounter for preoperative examination for general surgical procedure   3. Plantar flexed metatarsal, left    Plan:  Patient was evaluated and treated and all questions answered.  Left porokeratosis with underlying plantarflexed metatarsal -All questions and concerns were discussed with the patient in extensive detail.   -Given the amount of pain that she is experiencing in the setting of no relief with conservative care I believe she will benefit from left fifth metatarsal floating osteotomy to help take the pressure off of the submetatarsal 5 and give her relief.  She is a diabetic however well controlled so there is a low risk of wound complication however I discussed with her that complications are present when you are diabetic and you can end up losing toe or foot from it.  She states understanding would like to proceed despite the risks. -I discussed my preoperative intraoperative postoperative plan in extensive detail she states understand like to proceed with the surgery she can be weightbearing as tolerated with surgical shoe. -Informed surgical risk consent was reviewed and read aloud to the patient.  I reviewed the films.  I have discussed my findings with the patient in great detail.  I have discussed all risks including but not limited to infection, stiffness, scarring, limp, disability, deformity, damage to blood vessels and nerves, numbness, poor healing, need for braces, arthritis, chronic pain, amputation, death.  All benefits and realistic expectations discussed in great detail.  I have made no promises as to the outcome.  I have provided realistic expectations.  I have offered  the patient a 2nd opinion, which they have declined and assured me they preferred to proceed despite the risks   No follow-ups on file.

## 2021-11-22 ENCOUNTER — Other Ambulatory Visit: Payer: Self-pay | Admitting: Internal Medicine

## 2021-11-22 NOTE — Telephone Encounter (Signed)
Lmovm to verify quantity request.

## 2021-11-23 ENCOUNTER — Telehealth: Payer: Self-pay | Admitting: Internal Medicine

## 2021-11-23 ENCOUNTER — Other Ambulatory Visit: Payer: Self-pay | Admitting: *Deleted

## 2021-11-23 NOTE — Telephone Encounter (Signed)
LMOVM to clarify

## 2021-11-23 NOTE — Telephone Encounter (Signed)
Pt c/o medication issue:  1. Name of Medication:    2. How are you currently taking this medication (dosage and times per day)?    3. Are you having a reaction (difficulty breathing--STAT)?  no  4. What is your medication issue? Calling to get patient meds update and refill but unsure of which meds.

## 2021-11-24 NOTE — Telephone Encounter (Signed)
I spoke with a Briana Young at [facility]. She states Briana Young is not in the office today. She is not aware what the issue is regarding patients medications, but, she states her refills came in and they were all there.   I asked her to have Briana Young to call back if there was an ongoing issue.

## 2021-12-14 ENCOUNTER — Encounter: Payer: Medicare Other | Admitting: Podiatry

## 2021-12-20 ENCOUNTER — Other Ambulatory Visit: Payer: Self-pay | Admitting: Family Medicine

## 2021-12-20 DIAGNOSIS — Z1231 Encounter for screening mammogram for malignant neoplasm of breast: Secondary | ICD-10-CM

## 2021-12-28 ENCOUNTER — Encounter: Payer: Medicare Other | Admitting: Podiatry

## 2022-01-22 ENCOUNTER — Ambulatory Visit
Admission: RE | Admit: 2022-01-22 | Discharge: 2022-01-22 | Disposition: A | Discharge status: Home / Self Care | Payer: Medicare Other | Source: Ambulatory Visit | Attending: Family Medicine | Admitting: Family Medicine

## 2022-01-22 DIAGNOSIS — Z1231 Encounter for screening mammogram for malignant neoplasm of breast: Secondary | ICD-10-CM | POA: Diagnosis present

## 2022-01-30 ENCOUNTER — Other Ambulatory Visit: Payer: Self-pay | Admitting: Internal Medicine

## 2022-01-30 ENCOUNTER — Ambulatory Visit (INDEPENDENT_AMBULATORY_CARE_PROVIDER_SITE_OTHER): Payer: Medicare Other

## 2022-01-30 ENCOUNTER — Ambulatory Visit
Admission: EM | Admit: 2022-01-30 | Discharge: 2022-01-30 | Disposition: A | Discharge status: Home / Self Care | Payer: Medicare Other | Attending: Emergency Medicine | Admitting: Emergency Medicine

## 2022-01-30 DIAGNOSIS — S63501A Unspecified sprain of right wrist, initial encounter: Secondary | ICD-10-CM

## 2022-01-30 DIAGNOSIS — M25531 Pain in right wrist: Secondary | ICD-10-CM

## 2022-01-30 NOTE — Discharge Instructions (Addendum)
Your x-rays did not demonstrate any broken bones.  Apply ice to your wrist for 20 minutes at a time 2-3 times a day to help with pain and swelling.  You may also take over-the-counter Tylenol or ibuprofen according to the package instructions as needed for pain.  If your symptoms continue please return for reevaluation or see your primary care provider.

## 2022-01-30 NOTE — ED Triage Notes (Signed)
Pt c/o RT wrist pain, pt states she had a fall this morning. Caregiver accompanying patient. Caregiver states pt has been carrying a heavy backpack

## 2022-01-30 NOTE — ED Provider Notes (Signed)
[facility]    CSN: [id number] Arrival date & time: 01/30/22  1738      History   Chief Complaint Chief Complaint  Patient presents with   Wrist Pain    RT wrist    HPI Briana Young is a 56 y.o. female.   HPI  56 year old female here for evaluation of right wrist pain.  Patient is a member of a group home and at some point this morning she had an unwitnessed ground-level fall when she went out back to put trash in the trash can.  She has a member of the group home staff with her and the staff member reports that she did not report the pain of the fall at the time of injury but she reported later on this afternoon.  Patient is complaining of her fingers being swollen and an inability to move her wrist because of pain.  Past Medical History:  Diagnosis Date   Allergic rhinitis    Diabetes mellitus without complication (HCC)    Pt takes Metformin   Diastolic dysfunction    a. 04/2021 Echo: EF 55-60%, no rwma, GrI DD, Nl RV fxn, RVSP 19.83mmHg. Mild MR/AI.   Gingival hyperplasia    Hyperlipidemia    Hypertension    Mental retardation    Obesity    PAF (paroxysmal atrial fibrillation) (HCC)    a. 02/2011 Developed AF in the setting of PE following pancreatic mass excision.  No known recurrence -->CHA2DS2VASc = 3.   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   PSVT (paroxysmal supraventricular tachycardia) (HCC)    a. 03/2021 Seen in ED for PSVT.   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      Home Medications    Prior to Admission medications   Medication Sig Start Date End Date Taking? Authorizing Provider  aspirin EC 81 MG tablet Take 81 mg by mouth daily. Swallow whole.   Yes [provider]  atorvastatin (LIPITOR) 20 MG tablet Take 20 mg by mouth daily. 06/11/18  Yes [provider]  cetirizine (ZYRTEC) 10 MG chewable tablet Chew 10 mg by mouth daily.   Yes [provider]  clotrimazole (LOTRIMIN) 1 % cream Apply 1 application topically 2 (two) times daily.   Yes [provider]  fexofenadine (ALLEGRA) 180 MG tablet Take 180 mg by mouth daily.   Yes [provider]  fluticasone (FLONASE) 50 MCG/ACT nasal spray Place 2 sprays into both nostrils daily.   Yes [provider]  hydrocortisone cream 1 % Apply 1 application topically 2 (two) times daily as needed for itching.   Yes [provider]  ipratropium (ATROVENT) 0.03 % nasal spray Place 2 sprays into both nostrils every 12 (twelve) hours. 08/06/21  Yes Rhys Martini, PA-C  metFORMIN (GLUCOPHAGE) 500 MG tablet Take 500 mg by mouth daily with breakfast.   Yes [provider]  metoprolol tartrate (LOPRESSOR) 25 MG tablet TAKE 1 TABLET BY MOUTH TWICE DAILY HOLD IF PULSE <60 11/27/21  Yes End, Cristal Deer, MD  Multiple Vitamin (MULTIVITAMIN) capsule Take 1 capsule by mouth daily.   Yes [provider]  [facility] powder Apply 1 application topically once a week. 02/10/18  Yes [provider]  olopatadine (PATANOL) 0.1 % ophthalmic solution Place 1 drop into both eyes daily as needed for allergies.   Yes [provider]  omeprazole (PRILOSEC) 20 MG capsule Take 40 mg by mouth daily.   Yes [provider]  PARoxetine (PAXIL) 40 MG tablet  06/11/18  Yes [provider]  polyethylene glycol (MIRALAX / GLYCOLAX) 17 g packet Take 17 g by  mouth daily as needed.   Yes [provider]  acetaminophen (TYLENOL) 500 MG tablet Take 500 mg by mouth every 6 (six) hours as needed.    [provider]  lisinopril (PRINIVIL,ZESTRIL) 2.5 MG tablet Take 2.5 mg by mouth daily.    [provider]    Family History Family History  Problem Relation Age of Onset   Heart disease Father    Breast cancer Neg Hx     Social History Social History   Tobacco Use   Smoking status: Never   Smokeless tobacco: Never  Substance Use Topics   Alcohol use: No   Drug use: Never     Allergies   Patient has no known allergies.   Review of Systems Review of Systems  Musculoskeletal:  Positive for arthralgias and joint swelling.  Skin:  Negative for color change and wound.  Neurological:  Negative for weakness and numbness.  Hematological: Negative.   Psychiatric/Behavioral: Negative.       Physical Exam Triage Vital Signs ED Triage Vitals  Enc Vitals Group     BP 01/30/22 1758 138/70     Pulse Rate 01/30/22 1758 68     Resp --      Temp 01/30/22 1758 98.3 F (36.8 C)     Temp Source 01/30/22 1758 Oral     SpO2 01/30/22 1758 99 %     Weight 01/30/22 1754 145 lb 14.4 oz (66.2 kg)     Height --      Head Circumference --      Peak Flow --      Pain Score 01/30/22 1754 8     Pain Loc --      Pain Edu? --      Excl. in GC? --    No data found.  Updated Vital Signs BP 138/70 (BP Location: Left Arm)   Pulse 68   Temp 98.3 F (36.8 C) (Oral)   Wt 145 lb 14.4 oz (66.2 kg)   SpO2 99%   BMI 23.55 kg/m   Visual Acuity Right Eye Distance:   Left Eye Distance:   Bilateral Distance:    Right Eye Near:   Left Eye Near:    Bilateral Near:     Physical Exam Vitals and nursing note reviewed.  Constitutional:      Appearance: Normal appearance. She is not ill-appearing.  HENT:     Head: Normocephalic and atraumatic.  Musculoskeletal:        General: Swelling, tenderness and signs of injury  present. No deformity.  Skin:    General: Skin is warm and dry.     Capillary Refill: Capillary refill takes less than 2 seconds.     Findings: No bruising or erythema.  Neurological:  General: No focal deficit present.     Mental Status: She is alert and oriented to person, place, and time.  Psychiatric:        Mood and Affect: Mood normal.        Behavior: Behavior normal.        Thought Content: Thought content normal.        Judgment: Judgment normal.      UC Treatments / Results  Labs (all labs ordered are listed, but only abnormal results are displayed) Labs Reviewed - No data to display  EKG   Radiology DG Wrist Complete Right  Result Date: 01/30/2022 CLINICAL DATA:  Pain and limited range of motion after fall. EXAM: RIGHT WRIST - COMPLETE 3+ VIEW COMPARISON:  None Available. FINDINGS: There is no evidence of fracture or dislocation. Normal alignment and joint spaces. There is no evidence of significant arthropathy or other focal bone abnormality. Soft tissues are unremarkable. IMPRESSION: Negative radiographs of the right wrist. Electronically Signed   By: Narda Rutherford M.D.   On: 01/30/2022 19:03    Procedures Procedures (including critical care time)  Medications Ordered in UC Medications - No data to display  Initial Impression / Assessment and Plan / UC Course  I have reviewed the triage vital signs and the nursing notes.  Pertinent labs & imaging results that were available during my care of the patient were reviewed by me and considered in my medical decision making (see chart for details).   Patient is a very pleasant, nontoxic-appearing 56 year old female here for evaluation of right wrist pain that is a result of a ground-level fall that happened sometime early this morning.  Patient is complaining of pain in the central aspect of her wrist and proximal hand.  There is visible swelling but no ecchymosis or erythema noted.  Her fingers are warm and her  cap refills less than 2 seconds.  She cannot complete a fist due to the pain in her wrist.  She also cannot flex, extend, radial, or ulnar deviate her wrist due to pain.  She has no pain with palpation of her phalanges or with palpation of the first, second, or fifth metacarpal.  She does have pain with palpation of the proximal aspect of her third and fourth metacarpal as well as with palpation of her carpal joints.  No pain with compression of the radial and ulnar styloid.  I will obtain radiograph to rule out bony abnormality of her right wrist.  Right wrist x-rays independently reviewed and evaluated by me.  Impression: No evidence of fracture or dislocation.  There is some soft tissue swelling visible on the x-ray.  Radiology overread is pending. Radiology impression states no evidence of fracture or dislocation.  Normal alignment and joint spaces.  No evidence of significant arthropathy or focal bone abnormality.  Soft tissues unremarkable.  Negative radiographs.  I will discharge patient home with a diagnosis of right wrist sprain and treat her with over-the-counter ibuprofen as needed for pain.   Final Clinical Impressions(s) / UC Diagnoses   Final diagnoses:  Sprain of right wrist, initial encounter     Discharge Instructions      Your x-rays did not demonstrate any broken bones.  Apply ice to your wrist for 20 minutes at a time 2-3 times a day to help with pain and swelling.  You may also take over-the-counter Tylenol or ibuprofen according to the package instructions as needed for pain.  If your symptoms continue please return for  reevaluation or see your primary care provider.     ED Prescriptions   None    PDMP not reviewed this encounter.   Becky Augusta, NP 01/30/22 Windell Moment

## 2022-02-05 ENCOUNTER — Ambulatory Visit: Payer: Medicare Other | Admitting: Podiatry

## 2022-02-14 ENCOUNTER — Encounter: Payer: Self-pay | Admitting: Internal Medicine

## 2022-02-14 ENCOUNTER — Ambulatory Visit: Payer: Medicare Other | Attending: Internal Medicine | Admitting: Internal Medicine

## 2022-02-14 VITALS — BP 120/78 | HR 62 | Ht 66.0 in | Wt 147.0 lb

## 2022-02-14 DIAGNOSIS — I471 Supraventricular tachycardia, unspecified: Secondary | ICD-10-CM

## 2022-02-14 DIAGNOSIS — I480 Paroxysmal atrial fibrillation: Secondary | ICD-10-CM | POA: Diagnosis present

## 2022-02-14 DIAGNOSIS — E1159 Type 2 diabetes mellitus with other circulatory complications: Secondary | ICD-10-CM

## 2022-02-14 DIAGNOSIS — R002 Palpitations: Secondary | ICD-10-CM | POA: Diagnosis not present

## 2022-02-14 DIAGNOSIS — I152 Hypertension secondary to endocrine disorders: Secondary | ICD-10-CM | POA: Diagnosis present

## 2022-02-14 MED ORDER — METOPROLOL SUCCINATE ER 25 MG PO TB24: 25.0000 mg | ORAL_TABLET | Freq: Every day | ORAL | 1 refills | Status: DC

## 2022-02-14 NOTE — Progress Notes (Signed)
Follow-up Outpatient Visit Date: 02/14/2022  Primary Care Provider: Emogene Morgan, MD [address] [place] [address]  Chief Complaint: Follow-up SVT  HPI:  Briana Young is a 56 y.o. female with history of supraventricular tachycardia, paroxysmal atrial fibrillation that occurred in the setting of PE following pancreatic mass excision in 2012, hyperlipidemia, and type 2 diabetes mellitus, who presents for follow-up of SVT.  She was last seen in our office in early March by Ward Givens, NP.  She was feeling well at that time without symptoms to suggest recurrent SVT that had led to ED visit in 03/2021.  Preceding echo showed normal LVEF with grade 1 diastolic dysfunction and mild mitral regurgitation.  No medication changes or additional testing were pursued.  Today, Ms. Porche reports that she has been feeling fairly well though she still has brief self-limited palpitations from time to time.  They happen every few months.  She has not had any sustained palpitations like what she experienced with her SVT last year.  She complains that she feels lightheaded fairly frequently.  She has not passed out but had a mechanical fall not too long ago.  She had some swelling in her right hand attributed to a sprain.  She denies chest pain, shortness of breath, and edema.  --------------------------------------------------------------------------------------------------  Past Medical History:  Diagnosis Date   Allergic rhinitis    Diabetes mellitus without complication (HCC)    Pt takes Metformin   Diastolic dysfunction    a. 04/2021 Echo: EF 55-60%, no rwma, GrI DD, Nl RV fxn, RVSP 19.89mmHg. Mild MR/AI.   Gingival hyperplasia    Hyperlipidemia    Hypertension    Mental retardation    Obesity    PAF (paroxysmal atrial fibrillation) (HCC)    a. 02/2011 Developed AF in the setting of PE following pancreatic mass excision.  No known recurrence -->CHA2DS2VASc = 3.   Pancreatic mass    a.  01/2011 benign mass-->s/p excision.   PSVT (paroxysmal supraventricular tachycardia) (HCC)    a. 03/2021 Seen in ED for PSVT.   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   aspirin EC 81 MG tablet Take 81 mg by mouth daily. Swallow whole.   atorvastatin (LIPITOR) 20 MG tablet Take 20 mg by mouth daily.   clotrimazole (LOTRIMIN) 1 % cream Apply 1 application topically 2 (two) times daily.   fexofenadine (ALLEGRA) 180 MG tablet Take 180 mg by mouth daily.   fluticasone (FLONASE) 50 MCG/ACT nasal spray Place 2 sprays into both nostrils daily.   hydrocortisone cream 1 % Apply 1 application topically 2 (two) times daily as needed for itching.   ipratropium (ATROVENT) 0.03 % nasal spray Place 2 sprays into both nostrils every 12 (twelve) hours.   metFORMIN (GLUCOPHAGE) 500 MG tablet Take 500 mg by mouth 2 (two) times daily with a meal.   metoprolol tartrate (LOPRESSOR) 25 MG tablet TAKE 1 TABLET BY MOUTH TWICE DAILY *HOLD IF PULSE <60   Multiple Vitamin (MULTIVITAMIN) capsule Take 1 capsule by mouth  daily.   olopatadine (PATANOL) 0.1 % ophthalmic solution Place 1 drop into both eyes daily as needed for allergies.   omeprazole (PRILOSEC) 20 MG capsule Take 40 mg by mouth daily.   PARoxetine (PAXIL) 40 MG tablet Take 40 mg by mouth daily.   polyethylene glycol (MIRALAX / GLYCOLAX) 17 g packet Take 17 g by mouth daily as needed.    Allergies: Patient has no known allergies.  Social History   Tobacco Use   Smoking status: Never   Smokeless tobacco: Never  Substance Use Topics    Alcohol use: No   Drug use: Never    Family History  Problem Relation Age of Onset   Heart disease Father    Breast cancer Neg Hx     Review of Systems: A 12-system review of systems was performed and was negative except as noted in the HPI.  --------------------------------------------------------------------------------------------------  Physical Exam: BP 120/78 (BP Location: Left Arm, Patient Position: Sitting, Cuff Size: Normal)   Pulse 62   Ht 5\' 6"  (1.676 m)   Wt 147 lb (66.7 kg)   SpO2 99%   BMI 23.73 kg/m   General:  NAD.  Accompanied by caregiver. Neck: No JVD or HJR. Lungs: Clear to auscultation bilaterally without wheezes or crackles. Heart: Regular rate and rhythm without murmurs, rubs, or gallops. Abdomen: Soft, nontender, nondistended. Extremities: No lower extremity edema.  EKG: Normal sinus rhythm without abnormalities.  Lab Results  Component Value Date   WBC 9.0 04/10/2021   WBC 8.7 04/10/2021   HGB 12.9 04/10/2021   HGB 13.1 04/10/2021   HCT 37.5 04/10/2021   HCT 38.1 04/10/2021   MCV 88.0 04/10/2021   MCV 87.8 04/10/2021   PLT 409 (H) 04/10/2021   PLT 427 (H) 04/10/2021    Lab Results  Component Value Date   NA 137 04/10/2021   K 4.2 04/10/2021   CL 104 04/10/2021   CO2 22 04/10/2021   BUN 24 (H) 04/10/2021   CREATININE 1.49 (H) 04/10/2021   GLUCOSE 143 (H) 04/10/2021   ALT 16 04/10/2021    --------------------------------------------------------------------------------------------------  ASSESSMENT AND PLAN: Palpitations and supraventricular tachycardia: Ms. Mondor reports sporadic episodes of brief palpitations.  She has not had any sustained racing of her heart like what was noted with her SVT last year.  Given her concerns about frequent lightheadedness, we have agreed to switch metoprolol to tartrate to metoprolol succinate 25 mg daily.  I have asked her to check her blood pressure and heart rate at her group home if she has  further episodes of lightheadedness.  If palpitations worsen or lightheadedness persists, ambulatory cardiac monitoring will need to be considered.  Paroxysmal atrial fibrillation: No evidence of recurrence, though as noted above, if palpitations worsen, will need to consider ambulatory cardiac monitoring.  Defer anticoagulation given only documented episode of atrial fibrillation occurred in the perioperative period surrounding pancreatic mass excision 11 years ago.  Hypertension associated with type 2 diabetes mellitus: Blood pressure normal today.  As above, we will switch metoprolol to tartrate to metoprolol succinate 25 mg daily.  Previous lisinopril has been held with history of soft blood pressures.  Ongoing management of diabetes mellitus per PCP.  Follow-up: Return to clinic in 6 weeks.  Rogers Seeds, MD 02/14/2022 10:33 AM

## 2022-02-14 NOTE — Patient Instructions (Signed)
Medication Instructions:   Your physician has recommended you make the following change in your medication:   STOP metoprolol tartrate  START metoprolol succinate 25 mg daily AT BEDTIME  *If you need a refill on your cardiac medications before your next appointment, please call your pharmacy*   Lab Work:  None ordered  Testing/Procedures:  None ordered   Follow-Up: At [facility], you and your health needs are our priority.  As part of our continuing mission to provide you with exceptional heart care, we have created designated Provider Care Teams.  These Care Teams include your primary Cardiologist (physician) and Advanced Practice Providers (APPs -  Physician Assistants and Nurse Practitioners) who all work together to provide you with the care you need, when you need it.  We recommend signing up for the patient portal called "MyChart".  Sign up information is provided on this After Visit Summary.  MyChart is used to connect with patients for Virtual Visits (Telemedicine).  Patients are able to view lab/test results, encounter notes, upcoming appointments, etc.  Non-urgent messages can be sent to your provider as well.   To learn more about what you can do with MyChart, go to [email or web address].    Your next appointment:   6 week(s)  The format for your next appointment:   In Person  Provider:   You may see Yvonne Kendall, MD or one of the following Advanced Practice Providers on your designated Care Team:   Nicolasa Ducking, NP Eula Listen, PA-C Cadence Fransico Michael, PA-C Charlsie Quest, NP    Important Information About Sugar

## 2022-04-01 NOTE — Progress Notes (Unsigned)
Cardiology Clinic Note   Patient Name: Briana Young Date of Encounter: 04/03/2022  Primary Care Provider:  Donnie Coffin, MD Primary Cardiologist:  Nelva Bush, MD  Patient Profile    Briana Young is a 56 year-old female who presents to the clinic today for follow-up after medication change.  Past Medical History    Past Medical History:  Diagnosis Date   Allergic rhinitis    Diabetes mellitus without complication ([place])    Pt takes Metformin   Diastolic dysfunction    a. 04/2021 Echo: EF 55-60%, no rwma, GrI DD, Nl RV fxn, RVSP 19.54mmHg. Mild MR/AI.   Gingival hyperplasia    Hyperlipidemia    Hypertension    Mental retardation    Obesity    PAF (paroxysmal atrial fibrillation) ([place])    a. 02/2011 Developed AF in the setting of PE following pancreatic mass excision.  No known recurrence -->CHA2DS2VASc = 3.   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   PSVT (paroxysmal supraventricular tachycardia)    a. 03/2021 Seen in ED for PSVT.   Pulmonary embolism ([place]) 2012   on coumadin   Past Surgical History:  Procedure Laterality Date   COLONOSCOPY WITH PROPOFOL N/A 05/15/2018   Procedure: COLONOSCOPY WITH PROPOFOL;  Surgeon: Lin Landsman, MD;  Location: 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  One episode occurring 04/10/2021 causing dizziness and a fall in the shower. Converted to sinus rhythm after metoprolol in the emergency department.  Stable on metoprolol tartrate 25 mg twice daily with no further episodes. Echo 05/09/2021: EF 55 to 60%.  Grade I DD.  Mitral valve regurgitation without stenosis.  Mild aortic valve regurgitation without stenosis. PAF. In the setting of PE status post pancreatic mass excision in September 2012 with no known recurrence.    Anticoagulation has been deferred. Hyperlipidemia. Taking atorvastatin 20 mg  T2DM. Managed by PCP   Patient was last seen in the office by Dr. Saunders Revel on 02/14/2022 for follow-up of SVT.  At time she reported occasional brief palpitations.  She also reported lightheadedness fairly frequently.  Was changed to metoprolol succinate 25 mg daily.  If palpitations worsen or lightheaded dizziness becomes more persistent ambulatory cardiac monitoring should be considered.  Today, patient is accompanied by her aide.  She is doing well on metoprolol succinate.  She no longer feels dizzy and has not passed out.  She reports occasional lightheadedness if she gets up too fast.  She jokes that she does everything fast just like her grandmother.  She reports occasional palpitations that have not worsened since she was last seen.  She denies chest pain, shortness of breath, tachycardia, or lower extremity edema.  Her PCP manages her diabetes and regularly checks her labs.  Her aide reports she has an upcoming appointment in a couple of weeks.   Home Medications    Current Meds  Medication Sig   acetaminophen (TYLENOL) 500 MG tablet Take 500 mg by mouth every 6 (six) hours as needed.   aspirin EC 81 MG tablet Take 81 mg by mouth daily. Swallow whole.   atorvastatin (LIPITOR) 20 MG tablet Take 20 mg by mouth daily.   clotrimazole (LOTRIMIN) 1 % cream Apply 1 application topically 2 (  two) times daily.   fexofenadine (ALLEGRA) 180 MG tablet Take 180 mg by mouth daily.   fluticasone (FLONASE) 50 MCG/ACT nasal spray Place 2 sprays into both nostrils daily.   hydrocortisone cream 1 % Apply 1 application topically 2 (two) times daily as needed for itching.   ipratropium (ATROVENT) 0.03 % nasal spray Place 2 sprays into both nostrils every 12 (twelve) hours.   metFORMIN (GLUCOPHAGE) 500 MG tablet Take 500 mg by mouth 2 (two) times daily with a meal.   metoprolol succinate (TOPROL-XL) 25 MG 24 hr tablet Take 1 tablet  (25 mg total) by mouth at bedtime.   Multiple Vitamin (MULTIVITAMIN) capsule Take 1 capsule by mouth daily.   olopatadine (PATANOL) 0.1 % ophthalmic solution Place 1 drop into both eyes daily as needed for allergies.   omeprazole (PRILOSEC) 20 MG capsule Take 40 mg by mouth daily.   PARoxetine (PAXIL) 40 MG tablet Take 40 mg by mouth daily.   polyethylene glycol (MIRALAX / GLYCOLAX) 17 g packet Take 17 g by mouth daily as needed.    Family History    Family History  Problem Relation Age of Onset   Heart disease Father    Breast cancer Neg Hx    She indicated that her mother is deceased. She indicated that her father is deceased. She indicated that the status of her sister is unknown. She indicated that the status of her neg hx is unknown.   Social History    Social History   Socioeconomic History   Marital status: Single    Spouse name: Not on file   Number of children: Not on file   Years of education: Not on file   Highest education level: Not on file  Occupational History   Not on file  Tobacco Use   Smoking status: Never   Smokeless tobacco: Never  Vaping Use   Vaping Use: Not on file  Substance and Sexual Activity   Alcohol use: No   Drug use: Never   Sexual activity: Not on file  Other Topics Concern   Not on file  Social History Narrative   Not on file   Social Determinants of Health   Financial Resource Strain: Not on file  Food Insecurity: Not on file  Transportation Needs: Not on file  Physical Activity: Not on file  Stress: Not on file  Social Connections: Not on file  Intimate Partner Violence: Not on file     Review of Systems    General: No chills, fever, night sweats or weight changes.  Cardiovascular:  No chest pain, dyspnea on exertion, edema, orthopnea, palpitations, paroxysmal nocturnal dyspnea. Dermatological: No rash, lesions/masses Respiratory: No cough, dyspnea Urologic: No hematuria, dysuria Abdominal:   No nausea, vomiting,  diarrhea, bright red blood per rectum, melena, or hematemesis Neurologic:  No visual changes, weakness, changes in mental status. All other systems reviewed and are otherwise negative except as noted above.  Physical Exam    VS:  BP 132/84 (BP Location: Left Arm, Patient Position: Sitting, Cuff Size: Normal)   Pulse 68   Ht 5\' 6"  (1.676 m)   Wt 149 lb 3.2 oz (67.7 kg)   SpO2 99%   BMI 24.08 kg/m  , BMI Body mass index is 24.08 kg/m. GEN:  Well nourished, well developed, in no acute distress. HEENT: Normal. Neck: Supple, no JVD, carotid bruits, or masses. Cardiac: RRR, no murmurs, rubs, or gallops. No clubbing, cyanosis, edema.  Radials/DP/PT 2+ and equal bilaterally.  Respiratory:  Respirations regular and unlabored, clear to auscultation bilaterally. GI: Soft, nontender, nondistended. MS: No deformity or atrophy. Skin: Warm and dry, no rash. Neuro: Strength and sensation are intact. Psych: Normal affect.  Accessory Clinical Findings    The following studies were reviewed for this visit: Echo 05/09/2021: IMPRESSIONS    1. Left ventricular ejection fraction, by estimation, is 55 to 60%. The  left ventricle has normal function. The left ventricle has no regional  wall motion abnormalities. Left ventricular diastolic parameters are  consistent with Grade I diastolic dysfunction (impaired relaxation).   2. Right ventricular systolic function is normal. The right ventricular  size is normal. There is normal pulmonary artery systolic pressure. The estimated right ventricular systolic pressure is [id number] mmHg.   3. The mitral valve is normal in structure. Mild mitral valve  regurgitation. No evidence of mitral stenosis.   4. The aortic valve is normal in structure. Aortic valve regurgitation is  mild. No aortic stenosis is present.   5. The inferior vena cava is normal in size with greater than 50%  respiratory variability, suggesting right atrial pressure of 3 mmHg.   Recent  Labs: 04/10/2021: ALT 16; BUN 24; Creatinine, Ser 1.49; Hemoglobin 12.9; Hemoglobin 13.1; Platelets 409; Platelets 427; Potassium 4.2; Sodium 137   Recent Lipid Panel No results found for: "CHOL", "TRIG", "HDL", "CHOLHDL", "VLDL", "LDLCALC", "LDLDIRECT"       ECG personally reviewed by me today normal sinus rhythm, heart rate 68. unchanged from 02/14/2022    Assessment & Plan   Dizziness.  Patient is doing well on metoprolol succinate 25 mg at bedtime.  She no longer has dizziness and has not passed out.  She reports occasional lightheadedness if she gets up too fast.  Continue at current dose.  Will have CBC and BMP drawn with labs at upcoming PCP visit. Palpitations/PSVT.  She denies increased palpitations or feeling like her heart is racing.  Continue metoprolol succinate at bedtime.  If palpitations increase or become persistent will consider ambulatory cardiac monitoring. PAF.  1 episode of A-fib occurring with PE after benign pancreatic mass excision 11 years ago.  No evidence of recurrence.  We will continue to defer anticoagulation. Hyperlipidemia.  Patient's PCP draws labs.  We will request a lipid panel be drawn at next PCP visit.  Per aide accompanying patient today, she has a visit coming up in a couple of weeks. T2DM.  This managed by her PCP.   Disposition: CBC, BMP, lipid panel to be drawn by PCP at next visit.  Follow-up in 6 months or sooner as needed.   Briana Young. Briana Patnaude, NP-C     04/03/2022, 8:17 AM [place] [address] Office [phone] Fax [phone]   I spent 10 minutes examining this patient, reviewing medications, and using patient centered shared decision making involving her cardiac care.  Prior to her visit I spent greater than 20 minutes reviewing her past medical history,  medications, and prior cardiac tests.

## 2022-04-03 ENCOUNTER — Encounter: Payer: Self-pay | Admitting: Cardiology

## 2022-04-03 ENCOUNTER — Ambulatory Visit: Payer: Medicare Other | Attending: Nurse Practitioner | Admitting: Student

## 2022-04-03 VITALS — BP 132/84 | HR 68 | Ht 66.0 in | Wt 149.2 lb

## 2022-04-03 DIAGNOSIS — I480 Paroxysmal atrial fibrillation: Secondary | ICD-10-CM | POA: Diagnosis not present

## 2022-04-03 DIAGNOSIS — I471 Supraventricular tachycardia, unspecified: Secondary | ICD-10-CM | POA: Diagnosis not present

## 2022-04-03 DIAGNOSIS — E119 Type 2 diabetes mellitus without complications: Secondary | ICD-10-CM | POA: Insufficient documentation

## 2022-04-03 DIAGNOSIS — R002 Palpitations: Secondary | ICD-10-CM | POA: Insufficient documentation

## 2022-04-03 DIAGNOSIS — E782 Mixed hyperlipidemia: Secondary | ICD-10-CM | POA: Diagnosis present

## 2022-04-03 DIAGNOSIS — R42 Dizziness and giddiness: Secondary | ICD-10-CM | POA: Diagnosis not present

## 2022-04-03 NOTE — Patient Instructions (Addendum)
Medication Instructions:  No changes at this time.   *If you need a refill on your cardiac medications before your next appointment, please call your pharmacy*   Lab Work: Labs (CBC, BMP, and Lipid panel to be done at primary care office. These are fasting labs so nothing to eat or drink after midnight except sip of water with medications.   If you have labs (blood work) drawn today and your tests are completely normal, you will receive your results only by: [place] (if you have MyChart) OR A paper copy in the mail If you have any lab test that is abnormal or we need to change your treatment, we will call you to review the results.   Testing/Procedures: None   Follow-Up: At [facility], you and your health needs are our priority.  As part of our continuing mission to provide you with exceptional heart care, we have created designated Provider Care Teams.  These Care Teams include your primary Cardiologist (physician) and Advanced Practice Providers (APPs -  Physician Assistants and Nurse Practitioners) who all work together to provide you with the care you need, when you need it.   Your next appointment:   6 month(s)  The format for your next appointment:   In Person  Provider:   Nelva Bush, MD or Gerrie Nordmann, NP        Important Information About Sugar

## 2022-04-10 ENCOUNTER — Other Ambulatory Visit: Payer: Self-pay | Admitting: Internal Medicine

## 2022-04-12 ENCOUNTER — Ambulatory Visit (INDEPENDENT_AMBULATORY_CARE_PROVIDER_SITE_OTHER): Payer: Medicare Other | Admitting: Podiatry

## 2022-04-12 DIAGNOSIS — Q828 Other specified congenital malformations of skin: Secondary | ICD-10-CM

## 2022-04-12 DIAGNOSIS — M216X2 Other acquired deformities of left foot: Secondary | ICD-10-CM

## 2022-04-12 NOTE — Progress Notes (Signed)
This patient present to the office  with chief complaint of callus developing under the outside of ball of left foot..  She says this callus has become painful walking and wearing her shoes. Patient has been seen by Dr.  Posey Pronto.  She presents to the office for treatment of her painful callus.  Vascular  Dorsalis pedis and posterior tibial pulses are palpable  B/L.  Capillary return  WNL.  Temperature gradient is  WNL.  Skin turgor  WNL  Sensorium  Senn Weinstein monofilament wire  WNL. Normal tactile sensation.  Nail Exam  Patient has normal nails with no evidence of bacterial or fungal infection.  Orthopedic  Exam  Muscle tone and muscle strength  WNL.  No limitations of motion feet  B/L.  No crepitus or joint effusion noted.  Foot type is unremarkable and digits show no abnormalities.  Bony prominences are unremarkable.  Plantar flexed fifth metatarsal  left foot..  Skin  No open lesions.  Normal skin texture and turgor.  Callus/porokeratosis  sub 5th  left  Porokeratosis secondary plantar flexed fifth metatarsal  left  Debride callus/porokeratosis.  Discussed condition with patient. Discussed her sub sub 5th metabase left foot.  Gardiner Barefoot DPM

## 2022-11-12 ENCOUNTER — Encounter: Payer: Self-pay | Admitting: Obstetrics and Gynecology

## 2022-12-27 ENCOUNTER — Other Ambulatory Visit: Payer: Self-pay | Admitting: Family Medicine

## 2022-12-27 DIAGNOSIS — Z1231 Encounter for screening mammogram for malignant neoplasm of breast: Secondary | ICD-10-CM

## 2023-01-24 ENCOUNTER — Ambulatory Visit
Admission: RE | Admit: 2023-01-24 | Discharge: 2023-01-24 | Disposition: A | Discharge status: Home / Self Care | Payer: Medicare Other | Source: Ambulatory Visit | Attending: Family Medicine | Admitting: Family Medicine

## 2023-01-24 DIAGNOSIS — Z1231 Encounter for screening mammogram for malignant neoplasm of breast: Secondary | ICD-10-CM | POA: Diagnosis present

## 2023-03-11 ENCOUNTER — Other Ambulatory Visit: Payer: Self-pay

## 2023-03-11 ENCOUNTER — Emergency Department: Payer: Medicare Other

## 2023-03-11 ENCOUNTER — Ambulatory Visit (INDEPENDENT_AMBULATORY_CARE_PROVIDER_SITE_OTHER)
Admission: EM | Admit: 2023-03-11 | Discharge: 2023-03-11 | Disposition: A | Discharge status: Other Acute Inpatient Hospital | Payer: Medicare Other | Source: Home / Self Care | Attending: Family Medicine | Admitting: Family Medicine

## 2023-03-11 ENCOUNTER — Inpatient Hospital Stay
Admission: EM | Admit: 2023-03-11 | Discharge: 2023-03-13 | DRG: 103 | Disposition: A | Discharge status: Home / Self Care | Payer: Medicare Other | Attending: Internal Medicine | Admitting: Internal Medicine

## 2023-03-11 ENCOUNTER — Encounter: Payer: Self-pay | Admitting: Emergency Medicine

## 2023-03-11 DIAGNOSIS — F79 Unspecified intellectual disabilities: Secondary | ICD-10-CM | POA: Diagnosis present

## 2023-03-11 DIAGNOSIS — I4719 Other supraventricular tachycardia: Secondary | ICD-10-CM | POA: Diagnosis present

## 2023-03-11 DIAGNOSIS — Z90411 Acquired partial absence of pancreas: Secondary | ICD-10-CM

## 2023-03-11 DIAGNOSIS — I480 Paroxysmal atrial fibrillation: Secondary | ICD-10-CM | POA: Diagnosis present

## 2023-03-11 DIAGNOSIS — H5310 Unspecified subjective visual disturbances: Secondary | ICD-10-CM | POA: Diagnosis present

## 2023-03-11 DIAGNOSIS — E871 Hypo-osmolality and hyponatremia: Secondary | ICD-10-CM | POA: Insufficient documentation

## 2023-03-11 DIAGNOSIS — E1122 Type 2 diabetes mellitus with diabetic chronic kidney disease: Secondary | ICD-10-CM | POA: Diagnosis present

## 2023-03-11 DIAGNOSIS — R42 Dizziness and giddiness: Secondary | ICD-10-CM

## 2023-03-11 DIAGNOSIS — I129 Hypertensive chronic kidney disease with stage 1 through stage 4 chronic kidney disease, or unspecified chronic kidney disease: Secondary | ICD-10-CM | POA: Diagnosis present

## 2023-03-11 DIAGNOSIS — Z86711 Personal history of pulmonary embolism: Secondary | ICD-10-CM

## 2023-03-11 DIAGNOSIS — Z7901 Long term (current) use of anticoagulants: Secondary | ICD-10-CM

## 2023-03-11 DIAGNOSIS — R140 Abdominal distension (gaseous): Secondary | ICD-10-CM

## 2023-03-11 DIAGNOSIS — G43909 Migraine, unspecified, not intractable, without status migrainosus: Secondary | ICD-10-CM | POA: Diagnosis not present

## 2023-03-11 DIAGNOSIS — E785 Hyperlipidemia, unspecified: Secondary | ICD-10-CM | POA: Diagnosis present

## 2023-03-11 DIAGNOSIS — Z8249 Family history of ischemic heart disease and other diseases of the circulatory system: Secondary | ICD-10-CM

## 2023-03-11 DIAGNOSIS — R110 Nausea: Secondary | ICD-10-CM

## 2023-03-11 DIAGNOSIS — Z7984 Long term (current) use of oral hypoglycemic drugs: Secondary | ICD-10-CM

## 2023-03-11 DIAGNOSIS — E878 Other disorders of electrolyte and fluid balance, not elsewhere classified: Secondary | ICD-10-CM | POA: Diagnosis present

## 2023-03-11 DIAGNOSIS — N189 Chronic kidney disease, unspecified: Secondary | ICD-10-CM | POA: Insufficient documentation

## 2023-03-11 DIAGNOSIS — Z7982 Long term (current) use of aspirin: Secondary | ICD-10-CM

## 2023-03-11 DIAGNOSIS — Z79899 Other long term (current) drug therapy: Secondary | ICD-10-CM

## 2023-03-11 DIAGNOSIS — N1831 Chronic kidney disease, stage 3a: Secondary | ICD-10-CM | POA: Diagnosis present

## 2023-03-11 DIAGNOSIS — Z1152 Encounter for screening for COVID-19: Secondary | ICD-10-CM | POA: Insufficient documentation

## 2023-03-11 DIAGNOSIS — D72819 Decreased white blood cell count, unspecified: Secondary | ICD-10-CM | POA: Diagnosis present

## 2023-03-11 DIAGNOSIS — Y92009 Unspecified place in unspecified non-institutional (private) residence as the place of occurrence of the external cause: Secondary | ICD-10-CM

## 2023-03-11 DIAGNOSIS — K760 Fatty (change of) liver, not elsewhere classified: Secondary | ICD-10-CM | POA: Diagnosis present

## 2023-03-11 DIAGNOSIS — R519 Headache, unspecified: Secondary | ICD-10-CM | POA: Diagnosis present

## 2023-03-11 DIAGNOSIS — K5900 Constipation, unspecified: Secondary | ICD-10-CM | POA: Diagnosis present

## 2023-03-11 DIAGNOSIS — Z8673 Personal history of transient ischemic attack (TIA), and cerebral infarction without residual deficits: Secondary | ICD-10-CM

## 2023-03-11 DIAGNOSIS — I471 Supraventricular tachycardia, unspecified: Secondary | ICD-10-CM | POA: Diagnosis present

## 2023-03-11 DIAGNOSIS — T43225A Adverse effect of selective serotonin reuptake inhibitors, initial encounter: Secondary | ICD-10-CM | POA: Diagnosis present

## 2023-03-11 DIAGNOSIS — E119 Type 2 diabetes mellitus without complications: Secondary | ICD-10-CM

## 2023-03-11 DIAGNOSIS — Z9081 Acquired absence of spleen: Secondary | ICD-10-CM

## 2023-03-11 LAB — CBC WITH DIFFERENTIAL/PLATELET
Abs Immature Granulocytes: 0.02 10*3/uL (ref 0.00–0.07)
Basophils Absolute: 0 10*3/uL (ref 0.0–0.1)
Basophils Relative: 1 %
Eosinophils Absolute: 0 10*3/uL (ref 0.0–0.5)
Eosinophils Relative: 0 %
HCT: 39.9 % (ref 36.0–46.0)
Hemoglobin: 13.3 g/dL (ref 12.0–15.0)
Immature Granulocytes: 1 %
Lymphocytes Relative: 30 %
Lymphs Abs: 1.1 10*3/uL (ref 0.7–4.0)
MCH: 28.1 pg (ref 26.0–34.0)
MCHC: 33.3 g/dL (ref 30.0–36.0)
MCV: 84.2 fL (ref 80.0–100.0)
Monocytes Absolute: 0.2 10*3/uL (ref 0.1–1.0)
Monocytes Relative: 4 %
Neutro Abs: 2.4 10*3/uL (ref 1.7–7.7)
Neutrophils Relative %: 64 %
Platelets: 238 10*3/uL (ref 150–400)
RBC: 4.74 MIL/uL (ref 3.87–5.11)
RDW: 14 % (ref 11.5–15.5)
WBC: 3.7 10*3/uL — ABNORMAL LOW (ref 4.0–10.5)
nRBC: 0 % (ref 0.0–0.2)

## 2023-03-11 LAB — BASIC METABOLIC PANEL
Anion gap: 12 (ref 5–15)
BUN: 17 mg/dL (ref 6–20)
CO2: 24 mmol/L (ref 22–32)
Calcium: 9.1 mg/dL (ref 8.9–10.3)
Chloride: 91 mmol/L — ABNORMAL LOW (ref 98–111)
Creatinine, Ser: 1.22 mg/dL — ABNORMAL HIGH (ref 0.44–1.00)
GFR, Estimated: 52 mL/min — ABNORMAL LOW (ref >60)
Glucose, Bld: 116 mg/dL — ABNORMAL HIGH (ref 70–99)
Potassium: 4.3 mmol/L (ref 3.5–5.1)
Sodium: 127 mmol/L — ABNORMAL LOW (ref 135–145)

## 2023-03-11 LAB — SARS CORONAVIRUS 2 BY RT PCR: SARS Coronavirus 2 by RT PCR: NEGATIVE

## 2023-03-11 MED ORDER — SODIUM CHLORIDE 0.9 % IV BOLUS (SEPSIS)
1000.0000 mL | Freq: Once | INTRAVENOUS | Status: AC
  Administered 2023-03-11: 1000 mL via INTRAVENOUS

## 2023-03-11 MED ORDER — IOHEXOL 300 MG/ML  SOLN
100.0000 mL | Freq: Once | INTRAMUSCULAR | Status: AC | PRN
  Administered 2023-03-11: 100 mL via INTRAVENOUS

## 2023-03-11 MED ORDER — ACETAMINOPHEN 500 MG PO TABS
1000.0000 mg | ORAL_TABLET | Freq: Once | ORAL | Status: AC
  Administered 2023-03-11: 1000 mg via ORAL
  Filled 2023-03-11: qty 2

## 2023-03-11 MED ORDER — ONDANSETRON 4 MG PO TBDP
4.0000 mg | ORAL_TABLET | Freq: Once | ORAL | Status: AC
  Administered 2023-03-11: 4 mg via ORAL

## 2023-03-11 NOTE — ED Provider Notes (Signed)
[facility]    CSN: [id number] Arrival date & time: 03/11/23  1612      History   Chief Complaint Chief Complaint  Patient presents with   Headache   Dizziness    HPI Briana Young is a 57 y.o. female.   HPI  History provided by patient and group home caregiver.   Briana Young presents for headache, nasuea and dizziness that started Saturday while laying down.  Has sharp pains in her head.   No history of migraines or seizures. Has known fever. Has nausea and didn't eat lunch.  Had surgery on her abdomen where as she had lots of tubes and was hospitalized for a while.  Says they thought it was cancer but it turned out to be cancer.  Has noticed her abdomen bulging.  Has been constipated. There has been no vomiting, dyspepsia, chest discomfort.  Notes she has not had much of an appetite.       Past Medical History:  Diagnosis Date   Allergic rhinitis    Diabetes mellitus without complication (HCC)    Pt takes Metformin   Diastolic dysfunction    a. 04/2021 Echo: EF 55-60%, no rwma, GrI DD, Nl RV fxn, RVSP 19.36mmHg. Mild MR/AI.   Gingival hyperplasia    Hyperlipidemia    Hypertension    Mental retardation    Obesity    PAF (paroxysmal atrial fibrillation) ([facility])    a. 02/2011 Developed AF in the setting of PE following pancreatic mass excision.  No known recurrence -->CHA2DS2VASc = 3.   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   PSVT (paroxysmal supraventricular tachycardia) ([facility])    a. 03/2021 Seen in ED for PSVT.   Pulmonary embolism ([facility]) 2012   on coumadin    Patient Active Problem List   Diagnosis Date Noted   Headache 03/12/2023   Dizziness 03/12/2023   Acute headache 03/12/2023   Constipation 03/12/2023   Hypomagnesemia 03/12/2023   Stage 3a chronic kidney disease (HCC) 03/12/2023   History of removal of pancreatic cyst 2012 03/12/2023   Hyponatremia 03/12/2023   Intellectual disability 03/12/2023   Diabetes mellitus without  complication (HCC)    Palpitations 02/14/2022   Hypertension associated with type 2 diabetes mellitus (HCC) 02/14/2022   PSVT (paroxysmal supraventricular tachycardia) (HCC) 03/29/2011   Hyperlipidemia 03/29/2011   Pulmonary embolism (HCC) 03/29/2011   Pancreatic disorder 03/29/2011    Past Surgical History:  Procedure Laterality Date   COLONOSCOPY WITH PROPOFOL N/A 05/15/2018   Procedure: COLONOSCOPY WITH PROPOFOL;  Surgeon: Toney Reil, MD;  Location: [facility] ENDOSCOPY;  Service: Gastroenterology;  Laterality: N/A;   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      Home Medications    Prior to Admission medications   Medication Sig Start Date End Date Taking? Authorizing Provider  acetaminophen (TYLENOL) 500 MG tablet Take 500 mg by mouth every 6 (six) hours as needed.   Yes [provider]  aspirin EC 81 MG tablet Take 81 mg by mouth daily. Swallow whole.   Yes [provider]  atorvastatin (LIPITOR) 20 MG tablet Take 20 mg by mouth daily. 06/11/18  Yes [provider]  fexofenadine (ALLEGRA) 180 MG tablet Take 180 mg by mouth daily.   Yes [provider]  fluticasone (FLONASE) 50 MCG/ACT nasal spray Place 2 sprays into both nostrils daily.   Yes [provider]  metFORMIN (GLUCOPHAGE) 500 MG tablet Take 500 mg by mouth 2 (two) times daily with a meal.   Yes [provider]  metoprolol succinate (TOPROL-XL) 25 MG 24 hr tablet TAKE 1 TABLET BY MOUTH AT BEDTIME 04/10/22  Yes End, Cristal Deer, MD  Multiple Vitamin (MULTIVITAMIN) capsule Take 1 capsule by mouth daily.   Yes [provider]  omeprazole (PRILOSEC) 20 MG capsule Take 40 mg by mouth daily.   Yes [provider]  PARoxetine (PAXIL) 40 MG tablet Take 40 mg by mouth daily. 06/11/18  Yes [provider]  carbamide peroxide (DEBROX) 6.5 % OTIC solution Place 5-10 drops into both ears 2 (two)  times daily as needed (Ear wax removal). 02/25/23   [provider]  clotrimazole (LOTRIMIN) 1 % cream Apply 1 application topically 2 (two) times daily.    [provider]  hydrocortisone cream 1 % Apply 1 application topically 2 (two) times daily as needed for itching.    [provider]  olopatadine (PATANOL) 0.1 % ophthalmic solution Place 1 drop into both eyes daily as needed for allergies.    [provider]  polyethylene glycol (MIRALAX / GLYCOLAX) 17 g packet Take 17 g by mouth daily as needed.    [provider]  pseudoephedrine (SUDAFED) 60 MG tablet Take 60 mg by mouth every 6 (six) hours as needed. 02/25/23   [provider]  Sodium Fluoride 1.1 % PSTE Place 1 Application onto teeth at bedtime.    [provider]    Family History Family History  Problem Relation Age of Onset   Heart disease Father    Breast cancer Neg Hx     Social History Social History   Tobacco Use   Smoking status: Never   Smokeless tobacco: Never  Substance Use Topics   Alcohol use: No   Drug use: Never     Allergies   Patient has no known allergies.   Review of Systems Review of Systems: negative unless otherwise stated in HPI.      Physical Exam Triage Vital Signs ED Triage Vitals  Encounter Vitals Group     BP 03/11/23 1726 93/75     Systolic BP Percentile --      Diastolic BP Percentile --      Pulse Rate 03/11/23 1726 88     Resp 03/11/23 1726 19     Temp 03/11/23 1726 100 F (37.8 C)     Temp Source 03/11/23 1726 Oral     SpO2 03/11/23 1726 98 %     Weight 03/11/23 1723 135 lb (61.2 kg)     Height --      Head Circumference --      Peak Flow --      Pain Score 03/11/23 1723 9     Pain Loc --      Pain Education --      Exclude from Growth Chart --    No data found.   Updated Vital Signs BP 93/75 (BP Location: Right Arm)   Pulse 88   Temp 100 F (37.8 C) (Oral)   Resp 19   Wt 61.2 kg   SpO2 98%    BMI 21.79 kg/m   Visual Acuity Right Eye Distance:   Left Eye Distance:   Bilateral Distance:    Right Eye Near:   Left Eye Near:    Bilateral Near:     Physical Exam GEN:     alert, cooperative  female and no distress    HENT:  mucus membranes moist,  nares patent, no nasal discharge  RESP:  clear to auscultation bilaterally, no increased work of breathing  CVS:   regular rate and rhythm,  distal pulses intact   ABD:  soft, non-tender; bowel sounds present; epigastric and LUQ fullness       UC Treatments / Results  Labs (all labs ordered are listed, but only abnormal results are displayed) Labs Reviewed  BASIC METABOLIC PANEL - Abnormal; Notable for the following components:      Result Value   Sodium 127 (*)    Chloride 91 (*)    Glucose, Bld 116 (*)    Creatinine, Ser 1.22 (*)    GFR, Estimated 52 (*)    All other components within normal limits  CBC WITH DIFFERENTIAL/PLATELET - Abnormal; Notable for the following components:   WBC 3.7 (*)    All other components within normal limits  SARS CORONAVIRUS 2 BY RT PCR    EKG  If EKG performed, see my interpretation in the MDM section  Radiology    Procedures Procedures (including critical care time)  Medications Ordered in UC Medications  ondansetron (ZOFRAN-ODT) disintegrating tablet 4 mg (4 mg Oral Given 03/11/23 1855)    Initial Impression / Assessment and Plan / UC Course  I have reviewed the triage vital signs and the nursing notes.  Pertinent labs & imaging results that were available during my care of the patient were reviewed by me and considered in my medical decision making (see chart for details).       Patient is a 57 y.o. female  T2DM, HTN, HLD, CKD, intellectual delay who presents for headache, dizziness, and nausea .  Patient resides in a group home. Overall patient is nontoxic-appearing and afebrile.  Vital signs stable. 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  However, CT from September 2012 showed fluid collection in the lesser sac, fluid collection in the distal pancreatic tail with possible superimposed gas-forming infection with fat stranding and pockets of air in the subphrenic region at the splenectomy bed.  On exam, she has no abdominal tenderness but has some abdominal distention in the epigastric and left upper quadrant area.  Orthostatic vitals were unremarkable. EKG showing normal sinus rhythm without acute ST or T wave changes. CBC, BMP and COVID obtained.  He is COVID-negative.  Mild leukopenia which appears to be new compared to her trends over the last couple of years.  She is hypochloremic and hyponatremic, sodium 127, chloride 91.    Given her hyponatremia recommended ED evaluation.  Her caregiver does not feel comfortable driving her to the emergency department requesting EMS transport.  EMS called and updated upon arrival.  [facility] ED charge nurse updated on patient's transport via EMS.    Discussed MDM, treatment plan and plan for follow-up with patient and caregiver who agree with plan.     Final Clinical Impressions(s) / UC Diagnoses   Final diagnoses:  Hyponatremia  Abdominal distention  Dizziness  Nausea without vomiting     Discharge Instructions      Your sodium is low and needs repletion and monitoring.  You are giving a nausea medicine here.  Your abdomen is distended near your stomach and with your history you likely need a CT scan that is not available here.  Her caregiver requested transfer via EMS to the hospital.     ED Prescriptions  None    PDMP not reviewed this encounter.   Katha Cabal, DO 03/15/23 1020

## 2023-03-11 NOTE — ED Notes (Addendum)
Patient is being discharged from the Urgent Care and sent to the Emergency Department via EMS . Per Dr.Brimage, patient is in need of higher level of care due to hyponatremia,gastric mass & hx of mental retardation. Patient is aware and verbalizes understanding of plan of care.  Vitals:   03/11/23 1726  BP: 93/75  Pulse: 88  Resp: 19  Temp: 100 F (37.8 C)  SpO2: 98%

## 2023-03-11 NOTE — ED Provider Notes (Signed)
[facility] Provider Note    Event Date/Time   First MD Initiated Contact with Patient 03/11/23 2319     (approximate)   History   Abdominal Pain and Headache   HPI  Briana Young is a 57 y.o. female with history of enteral retardation, diabetes, hypertension, hyperlipidemia, diastolic dysfunction, paroxysmal A-fib, PE who presents to the emergency department from her group home for concerns for headache, dizziness and constipation.  No known fevers, cough, vomiting, diarrhea, chest pain, shortness of breath.  Staff took her to urgent care today and patient was found to have a sodium of 127 and was sent to the ER.   History provided by patient, group home staff.    Past Medical History:  Diagnosis Date   Allergic rhinitis    Diabetes mellitus without complication (HCC)    Pt takes Metformin   Diastolic dysfunction    a. 04/2021 Echo: EF 55-60%, no rwma, GrI DD, Nl RV fxn, RVSP 19.48mmHg. Mild MR/AI.   Gingival hyperplasia    Hyperlipidemia    Hypertension    Mental retardation    Obesity    PAF (paroxysmal atrial fibrillation) (HCC)    a. 02/2011 Developed AF in the setting of PE following pancreatic mass excision.  No known recurrence -->CHA2DS2VASc = 3.   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   PSVT (paroxysmal supraventricular tachycardia)    a. 03/2021 Seen in ED for PSVT.   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 Authorizing Provider  acetaminophen (TYLENOL) 500 MG tablet Take 500 mg by mouth every 6 (six) hours as needed.     [provider]  aspirin EC 81 MG tablet Take 81 mg by mouth daily. Swallow whole.    [provider]  atorvastatin (LIPITOR) 20 MG tablet Take 20 mg by mouth daily. 06/11/18   [provider]  clotrimazole (LOTRIMIN) 1 % cream Apply 1 application topically 2 (two) times daily.    [provider]  fexofenadine (ALLEGRA) 180 MG tablet Take 180 mg by mouth daily.    [provider]  fluticasone (FLONASE) 50 MCG/ACT nasal spray Place 2 sprays into both nostrils daily.    [provider]  hydrocortisone cream 1 % Apply 1 application topically 2 (two) times daily as needed for itching.    [provider]  ipratropium (ATROVENT) 0.03 % nasal spray Place 2 sprays into both nostrils every 12 (twelve) hours. 08/06/21   Rhys Martini, PA-C  metFORMIN (GLUCOPHAGE) 500 MG tablet Take 500 mg by mouth 2 (two) times daily with a meal.    [provider]  metoprolol succinate (TOPROL-XL) 25 MG 24 hr tablet TAKE 1 TABLET BY MOUTH AT BEDTIME 04/10/22   End, Cristal Deer, MD  Multiple Vitamin (MULTIVITAMIN) capsule Take 1 capsule by mouth daily.    [provider]  olopatadine (PATANOL) 0.1 % ophthalmic solution Place 1 drop into both eyes daily as needed for allergies.    [provider]  omeprazole (PRILOSEC) 20 MG capsule Take 40 mg by  mouth daily.    [provider]  PARoxetine (PAXIL) 40 MG tablet Take 40 mg by mouth daily. 06/11/18   [provider]  polyethylene glycol (MIRALAX / GLYCOLAX) 17 g packet Take 17 g by mouth daily as needed.    [provider]    Physical Exam   Triage Vital Signs: ED Triage Vitals  Encounter Vitals Group     BP 03/11/23 1946 (!) 146/88     Systolic BP Percentile --      Diastolic BP Percentile --      Pulse Rate 03/11/23 1946 83     Resp 03/11/23 1946 20     Temp 03/11/23 1946 100 F (37.8 C)     Temp Source 03/11/23 1946 Oral     SpO2 03/11/23 1946 100 %      Weight 03/11/23 1947 135 lb (61.2 kg)     Height --      Head Circumference --      Peak Flow --      Pain Score 03/11/23 1946 10     Pain Loc --      Pain Education --      Exclude from Growth Chart --     Most recent vital signs: Vitals:   03/12/23 0045 03/12/23 0059  BP:  (!) 143/94  Pulse: 93   Resp: 17   Temp:    SpO2: 100%     CONSTITUTIONAL: Alert, responds appropriately to questions. Well-appearing; well-nourished HEAD: Normocephalic, atraumatic EYES: Conjunctivae clear, pupils appear equal, sclera nonicteric ENT: normal nose; moist mucous membranes NECK: Supple, normal ROM, no meningismus CARD: Regular and tachycardic; S1 and S2 appreciated RESP: Normal chest excursion without splinting or tachypnea; breath sounds clear and equal bilaterally; no wheezes, no rhonchi, no rales, no hypoxia or respiratory distress, speaking full sentences ABD/GI: Non-distended; soft, non-tender, no rebound, no guarding, no peritoneal signs BACK: The back appears normal EXT: Normal ROM in all joints; no deformity noted, no edema SKIN: Normal color for age and race; warm; no rash on exposed skin NEURO: Moves all extremities equally, normal speech PSYCH: The patient's mood and manner are appropriate.   ED Results / Procedures / Treatments   LABS: (all labs ordered are listed, but only abnormal results are displayed) Labs Reviewed  URINALYSIS, ROUTINE W REFLEX MICROSCOPIC - Abnormal; Notable for the following components:      Result Value   Color, Urine YELLOW (*)    APPearance CLEAR (*)    Specific Gravity, Urine 1.042 (*)    Hgb urine dipstick MODERATE (*)    Ketones, ur 20 (*)    Leukocytes,Ua TRACE (*)    All other components within normal limits  COMPREHENSIVE METABOLIC PANEL - Abnormal; Notable for the following components:   Sodium 125 (*)    Chloride 90 (*)    Glucose, Bld 133 (*)    Creatinine, Ser 1.13 (*)    Calcium 8.6 (*)    AST 70 (*)    ALT 50 (*)    GFR,  Estimated 57 (*)    All other components within normal limits  MAGNESIUM - Abnormal; Notable for the following components:   Magnesium 1.6 (*)    All other components within normal limits  RESP PANEL BY RT-PCR (RSV, FLU A&B, COVID)  RVPGX2  LIPASE, BLOOD     EKG:   Date: 03/11/23 1739  Rate: 79  Rhythm: normal sinus rhythm  QRS Axis: normal  Intervals: normal  ST/T Wave abnormalities:  normal  Conduction Disutrbances: none  Narrative Interpretation: unremarkable     RADIOLOGY: My personal review and interpretation of imaging: CT head and abdomen pelvis showed no acute abnormality.  I have personally reviewed all radiology reports.   CT HEAD WO CONTRAST ( )  Result Date: 03/11/2023 CLINICAL DATA:  Headaches EXAM: CT HEAD WITHOUT CONTRAST TECHNIQUE: Contiguous axial images were obtained from the base of the skull through the vertex without intravenous contrast. RADIATION DOSE REDUCTION: This exam was performed according to the departmental dose-optimization program which includes automated exposure control, adjustment of the mA and/or kV according to patient size and/or use of iterative reconstruction technique. COMPARISON:  04/10/2021 FINDINGS: Brain: No evidence of acute infarction, hemorrhage, hydrocephalus, extra-axial collection or mass lesion/mass effect. Old right cerebellar infarct is noted and stable. Vascular: No hyperdense vessel or unexpected calcification. Skull: Normal. Negative for fracture or focal lesion. Sinuses/Orbits: No acute finding. Other: None. IMPRESSION: No acute intracranial abnormality noted. Electronically Signed   By: Alcide Clever M.D.   On: 03/11/2023 22:54   CT ABDOMEN PELVIS W CONTRAST  Result Date: 03/11/2023 CLINICAL DATA:  Acute abdominal pain for 2 days, initial encounter EXAM: CT ABDOMEN AND PELVIS WITH CONTRAST TECHNIQUE: Multidetector CT imaging of the abdomen and pelvis was performed using the standard protocol following bolus administration of  intravenous contrast. RADIATION DOSE REDUCTION: This exam was performed according to the departmental dose-optimization program which includes automated exposure control, adjustment of the mA and/or kV according to patient size and/or use of iterative reconstruction technique. CONTRAST:  OMNIPAQUE IOHEXOL 300 MG/ML  SOLN COMPARISON:  05/06/2018 FINDINGS: Lower chest: No acute abnormality. Hepatobiliary: Fatty infiltration of the liver is noted. The gallbladder is within normal limits. Pancreas: Distal pancreatectomy is noted. The residual pancreas is within normal limits. Spleen: Surgically removed. A few small splenules are again identified. Adrenals/Urinary Tract: Adrenal glands are within normal limits. Kidneys demonstrate a normal enhancement pattern bilaterally. Simple cyst is noted in the lower pole of the right kidney. No follow-up is recommended. No calculi or obstructive changes are seen. The bladder is decompressed. Stomach/Bowel: Fecal material is noted throughout the colon consistent with a degree of constipation. No obstructive changes are noted. The appendix is within normal limits. Small bowel and stomach are unremarkable. Vascular/Lymphatic: Aortic atherosclerosis. No enlarged abdominal or pelvic lymph nodes. Reproductive: Status post hysterectomy. No adnexal masses. Other: No abdominal wall hernia or abnormality. No abdominopelvic ascites. Musculoskeletal: No acute or significant osseous findings. IMPRESSION: Postsurgical changes are noted. Changes of mild colonic constipation. No other acute abnormality is noted. Electronically Signed   By: Alcide Clever M.D.   On: 03/11/2023 22:53     PROCEDURES:  Critical Care performed: Yes, see critical care procedure note(s)   CRITICAL CARE Performed by: Baxter Hire Yurani Fettes   Total critical care time: 40 minutes  Critical care time was exclusive of separately billable procedures and treating other patients.  Critical care was necessary to treat  or prevent imminent or life-threatening deterioration.  Critical care was time spent personally by me on the following activities: development of treatment plan with patient and/or surrogate as well as nursing, discussions with consultants, evaluation of patient's response to treatment, examination of patient, obtaining history from patient or surrogate, ordering and performing treatments and interventions, ordering and review of laboratory studies, ordering and review of radiographic studies, pulse oximetry and re-evaluation of patient's condition.   Marland Kitchen1-3 Lead EKG Interpretation  Performed by: Cleaven Demario, Layla Maw, DO Authorized by: Ruby Dilone, Layla Maw, DO  Interpretation: abnormal     ECG rate:  116   ECG rate assessment: tachycardic     Rhythm: sinus tachycardia     Ectopy: none     Conduction: normal       IMPRESSION / MDM / ASSESSMENT AND PLAN / ED COURSE  I reviewed the triage vital signs and the nursing notes.    Patient here with concerns for headache, dizziness, constipation.  Found to be hyponatremic at urgent care was sent to the ED.  The patient is on the cardiac monitor to evaluate for evidence of arrhythmia and/or significant heart rate changes.   DIFFERENTIAL DIAGNOSIS (includes but not limited to):   Dehydration, electrolyte derangement, patient has low-grade temperature here of 100 which could be due to viral illness, UTI, doubt pneumonia, meningitis   Patient's presentation is most consistent with acute presentation with potential threat to life or bodily function.   PLAN: Will repeat labs, COVID and flu swab, obtain urinalysis.  CT head and CT abdomen pelvis reviewed and interpreted by myself and the radiologist and show mild constipation but no other acute abnormality.  Will give IV fluids.  Will give Tylenol for headache and low-grade temperature of 100.  CBC from urgent care showed leukopenia of 3.7.  Suspect patient has a viral illness.  MEDICATIONS GIVEN IN  ED: Medications  magnesium sulfate IVPB 2 g 50 mL (has no administration in time range)  iohexol (OMNIPAQUE) 300 MG/ML solution 100 mL (100 mLs Intravenous Contrast Given 03/11/23 2112)  sodium chloride 0.9 % bolus 1,000 mL (0 mLs Intravenous Stopped 03/12/23 0008)  acetaminophen (TYLENOL) tablet 1,000 mg (1,000 mg Oral Given 03/11/23 2337)     ED COURSE: Patient sodium level on recheck is 125.  This appears to be new for her and given she is symptomatic, have recommended admission.  Urine shows no sign of infection.  Magnesium level is also low at 1.6.  Will give IV replacement. CONSULTS:  Consulted and discussed patient's case with hospitalist, Dr. Para March.  I have recommended admission and consulting physician agrees and will place admission orders.  Patient (and family if present) agree with this plan.   I reviewed all nursing notes, vitals, pertinent previous records.  All labs, EKGs, imaging ordered have been independently reviewed and interpreted by myself.    OUTSIDE RECORDS REVIEWED: Reviewed last cardiology note on 04/03/2022.       FINAL CLINICAL IMPRESSION(S) / ED DIAGNOSES   Final diagnoses:  Hyponatremia  Constipation, unspecified constipation type  Hypomagnesemia     Rx / DC Orders   ED Discharge Orders     None        Note:  This document was prepared using Dragon voice recognition software and may include unintentional dictation errors.   Tiegan Terpstra, Layla Maw, DO 03/12/23 0102

## 2023-03-11 NOTE — ED Triage Notes (Signed)
Pt sent over from [facility] via [facility] for evaluation of HA, abdominal pain and constipation. Labwork shows Na+ 127 from UC. Pt also states her abdomen has been swelling for 2 days.  Pt lives at [facility], has legal guardian Bonita Quin Click) on file.   VS en route: 142/79 85 100% 18

## 2023-03-11 NOTE — ED Triage Notes (Addendum)
Headache-dizziness-nausea since sat. No bm since sat.

## 2023-03-11 NOTE — Discharge Instructions (Signed)
Your sodium is low and needs repletion and monitoring.  You are giving a nausea medicine here.  Your abdomen is distended near your stomach and with your history you likely need a CT scan that is not available here.  Her caregiver requested transfer via EMS to the hospital.

## 2023-03-11 NOTE — ED Notes (Signed)
Patient transported to CT 

## 2023-03-12 ENCOUNTER — Observation Stay: Payer: Medicare Other

## 2023-03-12 ENCOUNTER — Other Ambulatory Visit: Payer: Self-pay

## 2023-03-12 ENCOUNTER — Encounter: Payer: Self-pay | Admitting: Radiology

## 2023-03-12 DIAGNOSIS — Y92009 Unspecified place in unspecified non-institutional (private) residence as the place of occurrence of the external cause: Secondary | ICD-10-CM | POA: Diagnosis not present

## 2023-03-12 DIAGNOSIS — D72819 Decreased white blood cell count, unspecified: Secondary | ICD-10-CM | POA: Diagnosis present

## 2023-03-12 DIAGNOSIS — F79 Unspecified intellectual disabilities: Secondary | ICD-10-CM | POA: Diagnosis present

## 2023-03-12 DIAGNOSIS — E871 Hypo-osmolality and hyponatremia: Principal | ICD-10-CM

## 2023-03-12 DIAGNOSIS — Z9081 Acquired absence of spleen: Secondary | ICD-10-CM | POA: Diagnosis not present

## 2023-03-12 DIAGNOSIS — K760 Fatty (change of) liver, not elsewhere classified: Secondary | ICD-10-CM | POA: Diagnosis present

## 2023-03-12 DIAGNOSIS — E119 Type 2 diabetes mellitus without complications: Secondary | ICD-10-CM

## 2023-03-12 DIAGNOSIS — I129 Hypertensive chronic kidney disease with stage 1 through stage 4 chronic kidney disease, or unspecified chronic kidney disease: Secondary | ICD-10-CM | POA: Diagnosis present

## 2023-03-12 DIAGNOSIS — E785 Hyperlipidemia, unspecified: Secondary | ICD-10-CM | POA: Diagnosis present

## 2023-03-12 DIAGNOSIS — Z8249 Family history of ischemic heart disease and other diseases of the circulatory system: Secondary | ICD-10-CM | POA: Diagnosis not present

## 2023-03-12 DIAGNOSIS — R42 Dizziness and giddiness: Secondary | ICD-10-CM

## 2023-03-12 DIAGNOSIS — K5900 Constipation, unspecified: Secondary | ICD-10-CM

## 2023-03-12 DIAGNOSIS — Z90411 Acquired partial absence of pancreas: Secondary | ICD-10-CM

## 2023-03-12 DIAGNOSIS — E1122 Type 2 diabetes mellitus with diabetic chronic kidney disease: Secondary | ICD-10-CM | POA: Diagnosis present

## 2023-03-12 DIAGNOSIS — I480 Paroxysmal atrial fibrillation: Secondary | ICD-10-CM | POA: Diagnosis present

## 2023-03-12 DIAGNOSIS — Z86711 Personal history of pulmonary embolism: Secondary | ICD-10-CM | POA: Diagnosis not present

## 2023-03-12 DIAGNOSIS — Z79899 Other long term (current) drug therapy: Secondary | ICD-10-CM | POA: Diagnosis not present

## 2023-03-12 DIAGNOSIS — N1831 Chronic kidney disease, stage 3a: Secondary | ICD-10-CM | POA: Diagnosis present

## 2023-03-12 DIAGNOSIS — Z7901 Long term (current) use of anticoagulants: Secondary | ICD-10-CM | POA: Diagnosis not present

## 2023-03-12 DIAGNOSIS — Z7984 Long term (current) use of oral hypoglycemic drugs: Secondary | ICD-10-CM | POA: Diagnosis not present

## 2023-03-12 DIAGNOSIS — E878 Other disorders of electrolyte and fluid balance, not elsewhere classified: Secondary | ICD-10-CM | POA: Diagnosis present

## 2023-03-12 DIAGNOSIS — T43225A Adverse effect of selective serotonin reuptake inhibitors, initial encounter: Secondary | ICD-10-CM | POA: Diagnosis present

## 2023-03-12 DIAGNOSIS — Z1152 Encounter for screening for COVID-19: Secondary | ICD-10-CM | POA: Diagnosis not present

## 2023-03-12 DIAGNOSIS — G43909 Migraine, unspecified, not intractable, without status migrainosus: Secondary | ICD-10-CM | POA: Diagnosis present

## 2023-03-12 DIAGNOSIS — Z8673 Personal history of transient ischemic attack (TIA), and cerebral infarction without residual deficits: Secondary | ICD-10-CM | POA: Diagnosis not present

## 2023-03-12 DIAGNOSIS — I4719 Other supraventricular tachycardia: Secondary | ICD-10-CM | POA: Diagnosis present

## 2023-03-12 DIAGNOSIS — R519 Headache, unspecified: Secondary | ICD-10-CM | POA: Diagnosis present

## 2023-03-12 DIAGNOSIS — Z7982 Long term (current) use of aspirin: Secondary | ICD-10-CM | POA: Diagnosis not present

## 2023-03-12 LAB — CBC
HCT: 37.5 % (ref 36.0–46.0)
Hemoglobin: 12.4 g/dL (ref 12.0–15.0)
MCH: 27.7 pg (ref 26.0–34.0)
MCHC: 33.1 g/dL (ref 30.0–36.0)
MCV: 83.9 fL (ref 80.0–100.0)
Platelets: 206 10*3/uL (ref 150–400)
RBC: 4.47 MIL/uL (ref 3.87–5.11)
RDW: 13.8 % (ref 11.5–15.5)
WBC: 2.3 10*3/uL — ABNORMAL LOW (ref 4.0–10.5)
nRBC: 0 % (ref 0.0–0.2)

## 2023-03-12 LAB — BASIC METABOLIC PANEL
Anion gap: 9 (ref 5–15)
BUN: 14 mg/dL (ref 6–20)
CO2: 27 mmol/L (ref 22–32)
Calcium: 8.5 mg/dL — ABNORMAL LOW (ref 8.9–10.3)
Chloride: 96 mmol/L — ABNORMAL LOW (ref 98–111)
Creatinine, Ser: 1.04 mg/dL — ABNORMAL HIGH (ref 0.44–1.00)
GFR, Estimated: >60 mL/min (ref >60)
Glucose, Bld: 115 mg/dL — ABNORMAL HIGH (ref 70–99)
Potassium: 3.8 mmol/L (ref 3.5–5.1)
Sodium: 132 mmol/L — ABNORMAL LOW (ref 135–145)

## 2023-03-12 LAB — COMPREHENSIVE METABOLIC PANEL
ALT: 50 U/L — ABNORMAL HIGH (ref 0–44)
AST: 70 U/L — ABNORMAL HIGH (ref 15–41)
Albumin: 3.6 g/dL (ref 3.5–5.0)
Alkaline Phosphatase: 93 U/L (ref 38–126)
Anion gap: 12 (ref 5–15)
BUN: 17 mg/dL (ref 6–20)
CO2: 23 mmol/L (ref 22–32)
Calcium: 8.6 mg/dL — ABNORMAL LOW (ref 8.9–10.3)
Chloride: 90 mmol/L — ABNORMAL LOW (ref 98–111)
Creatinine, Ser: 1.13 mg/dL — ABNORMAL HIGH (ref 0.44–1.00)
GFR, Estimated: 57 mL/min — ABNORMAL LOW (ref >60)
Glucose, Bld: 133 mg/dL — ABNORMAL HIGH (ref 70–99)
Potassium: 4.2 mmol/L (ref 3.5–5.1)
Sodium: 125 mmol/L — ABNORMAL LOW (ref 135–145)
Total Bilirubin: 0.5 mg/dL (ref 0.3–1.2)
Total Protein: 7.3 g/dL (ref 6.5–8.1)

## 2023-03-12 LAB — RESP PANEL BY RT-PCR (RSV, FLU A&B, COVID)  RVPGX2
Influenza A by PCR: NEGATIVE
Influenza B by PCR: NEGATIVE
Resp Syncytial Virus by PCR: NEGATIVE
SARS Coronavirus 2 by RT PCR: NEGATIVE

## 2023-03-12 LAB — CBG MONITORING, ED
Glucose-Capillary: 136 mg/dL — ABNORMAL HIGH (ref 70–99)
Glucose-Capillary: 142 mg/dL — ABNORMAL HIGH (ref 70–99)
Glucose-Capillary: 121 mg/dL — ABNORMAL HIGH (ref 70–99)
Glucose-Capillary: 126 mg/dL — ABNORMAL HIGH (ref 70–99)

## 2023-03-12 LAB — OSMOLALITY, URINE: Osmolality, Ur: 350 mosm/kg (ref 300–900)

## 2023-03-12 LAB — URINALYSIS, ROUTINE W REFLEX MICROSCOPIC
Bacteria, UA: NONE SEEN
Bilirubin Urine: NEGATIVE
Glucose, UA: NEGATIVE mg/dL
Ketones, ur: 20 mg/dL — AB
Nitrite: NEGATIVE
Protein, ur: NEGATIVE mg/dL
Specific Gravity, Urine: 1.042 — ABNORMAL HIGH (ref 1.005–1.030)
pH: 5 (ref 5.0–8.0)

## 2023-03-12 LAB — LIPASE, BLOOD: Lipase: 31 U/L (ref 11–51)

## 2023-03-12 LAB — SODIUM, URINE, RANDOM: Sodium, Ur: 24 mmol/L

## 2023-03-12 LAB — MAGNESIUM: Magnesium: 1.6 mg/dL — ABNORMAL LOW (ref 1.7–2.4)

## 2023-03-12 LAB — HIV ANTIBODY (ROUTINE TESTING W REFLEX): HIV Screen 4th Generation wRfx: NONREACTIVE

## 2023-03-12 LAB — OSMOLALITY: Osmolality: 281 mosm/kg (ref 275–295)

## 2023-03-12 LAB — TSH: TSH: 0.424 u[IU]/mL (ref 0.350–4.500)

## 2023-03-12 LAB — URIC ACID: Uric Acid, Serum: 4.7 mg/dL (ref 2.5–7.1)

## 2023-03-12 LAB — CORTISOL-AM, BLOOD: Cortisol - AM: 11.5 ug/dL (ref 6.7–22.6)

## 2023-03-12 MED ORDER — MECLIZINE HCL 25 MG PO TABS: 12.5000 mg | ORAL_TABLET | Freq: Three times a day (TID) | ORAL | Status: DC | PRN

## 2023-03-12 MED ORDER — ACETAMINOPHEN 650 MG RE SUPP: 650.0000 mg | Freq: Four times a day (QID) | RECTAL | Status: DC | PRN

## 2023-03-12 MED ORDER — ONDANSETRON HCL 4 MG/2ML IJ SOLN
4.0000 mg | Freq: Four times a day (QID) | INTRAMUSCULAR | Status: DC | PRN
  Administered 2023-03-13: 4 mg via INTRAVENOUS
  Filled 2023-03-12: qty 2

## 2023-03-12 MED ORDER — ONDANSETRON HCL 4 MG PO TABS: 4.0000 mg | ORAL_TABLET | Freq: Four times a day (QID) | ORAL | Status: DC | PRN

## 2023-03-12 MED ORDER — METOPROLOL SUCCINATE ER 50 MG PO TB24
25.0000 mg | ORAL_TABLET | Freq: Every day | ORAL | Status: DC
  Administered 2023-03-12 (×2): 25 mg via ORAL
  Filled 2023-03-12 (×2): qty 1

## 2023-03-12 MED ORDER — PAROXETINE HCL 20 MG PO TABS
40.0000 mg | ORAL_TABLET | Freq: Every day | ORAL | Status: DC
  Administered 2023-03-12 – 2023-03-13 (×2): 40 mg via ORAL
  Filled 2023-03-12 (×2): qty 2

## 2023-03-12 MED ORDER — POLYETHYLENE GLYCOL 3350 17 G PO PACK
17.0000 g | PACK | Freq: Every day | ORAL | Status: DC
  Administered 2023-03-12 – 2023-03-13 (×2): 17 g via ORAL
  Filled 2023-03-12 (×2): qty 1

## 2023-03-12 MED ORDER — INSULIN ASPART 100 UNIT/ML IJ SOLN
0.0000 [IU] | Freq: Three times a day (TID) | INTRAMUSCULAR | Status: DC
  Administered 2023-03-12 (×3): 1 [IU] via SUBCUTANEOUS
  Administered 2023-03-13: 2 [IU] via SUBCUTANEOUS
  Administered 2023-03-13: 1 [IU] via SUBCUTANEOUS
  Filled 2023-03-12 (×4): qty 1

## 2023-03-12 MED ORDER — SODIUM CHLORIDE 0.9 % IV SOLN: INTRAVENOUS | Status: AC

## 2023-03-12 MED ORDER — ASPIRIN 81 MG PO TBEC
81.0000 mg | DELAYED_RELEASE_TABLET | Freq: Every day | ORAL | Status: DC
  Administered 2023-03-12 – 2023-03-13 (×2): 81 mg via ORAL
  Filled 2023-03-12 (×2): qty 1

## 2023-03-12 MED ORDER — INSULIN ASPART 100 UNIT/ML IJ SOLN: 0.0000 [IU] | Freq: Every day | INTRAMUSCULAR | Status: DC

## 2023-03-12 MED ORDER — PANTOPRAZOLE SODIUM 40 MG PO TBEC
40.0000 mg | DELAYED_RELEASE_TABLET | Freq: Every day | ORAL | Status: DC
  Administered 2023-03-12 – 2023-03-13 (×2): 40 mg via ORAL
  Filled 2023-03-12 (×2): qty 1

## 2023-03-12 MED ORDER — ENOXAPARIN SODIUM 40 MG/0.4ML IJ SOSY
40.0000 mg | PREFILLED_SYRINGE | INTRAMUSCULAR | Status: DC
  Administered 2023-03-12 – 2023-03-13 (×2): 40 mg via SUBCUTANEOUS
  Filled 2023-03-12 (×2): qty 0.4

## 2023-03-12 MED ORDER — MAGNESIUM SULFATE 2 GM/50ML IV SOLN
2.0000 g | Freq: Once | INTRAVENOUS | Status: AC
  Administered 2023-03-12: 2 g via INTRAVENOUS
  Filled 2023-03-12: qty 50

## 2023-03-12 MED ORDER — ATORVASTATIN CALCIUM 20 MG PO TABS
20.0000 mg | ORAL_TABLET | Freq: Every day | ORAL | Status: DC
  Administered 2023-03-12 – 2023-03-13 (×2): 20 mg via ORAL
  Filled 2023-03-12 (×2): qty 1

## 2023-03-12 MED ORDER — HYDROCODONE-ACETAMINOPHEN 5-325 MG PO TABS: 1.0000 | ORAL_TABLET | ORAL | Status: DC | PRN

## 2023-03-12 MED ORDER — ACETAMINOPHEN 325 MG PO TABS
650.0000 mg | ORAL_TABLET | Freq: Four times a day (QID) | ORAL | Status: DC | PRN
  Administered 2023-03-12 – 2023-03-13 (×4): 650 mg via ORAL
  Filled 2023-03-12 (×4): qty 2

## 2023-03-12 NOTE — Assessment & Plan Note (Signed)
No acute issues.  Residual pancreas appears normal on CT

## 2023-03-12 NOTE — Assessment & Plan Note (Addendum)
History of postoperative paroxysmal A-fib 2012 Follows with cardiology, last seen end of 2023 Continue metoprolol, aspirin.  Not on systemic anticoagulation Will keep on cardiac monitor given headache and dizziness

## 2023-03-12 NOTE — ED Notes (Signed)
Pt left to MRI

## 2023-03-12 NOTE — Assessment & Plan Note (Signed)
Seen on abdominal CT Daily MiraLAX

## 2023-03-12 NOTE — ED Notes (Signed)
Pt returned from MRI °

## 2023-03-12 NOTE — Consult Note (Signed)
[facility]  CONSULT NOTE    Date: 03/12/2023                  Patient Name:  Briana Young  MRN: [id number]  DOB: 1966-06-09  Age / Sex: 57 y.o., female         PCP: Emogene Morgan, MD                 Service Requesting Consult: TRH                 Reason for Consult: Hyponatremia            History of Present Illness: Briana Young is a 57 y.o.  female with past medical conditions including diabetes, hypertension, hyperlipidemia, postop A-fib in 2012, intellectual disability, and chronic kidney disease stage III A, who was admitted to [facility] on 03/11/2023 for Acute headache [R51.9]  Patient presents to the emergency room at the referral of urgent care due to abnormal labs.  Patient states she initially seek assistance due to a headache and constipation.  Labs at urgent care showed decreased sodium.  Patient states she has felt fine, reports no recent illness in the past few days or weeks.  Denies nausea or vomiting.  Denies shortness of breath or cough.  Denies diarrhea.  Is unsure of this time  of last bowel movement.  Labs on ED arrival significant for sodium 127, glucose 119, creatinine 1.22 with GFR 52.  Respiratory panel negative for RSV, COVID-19, and influenza.  UA appears clear.  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  CT abdomen pelvis show mild constipation.   Medications: Outpatient medications: (Not in a hospital admission)   Current medications: Current Facility-Administered Medications  Medication Dose Route Frequency Provider Last Rate Last Admin   acetaminophen (TYLENOL) tablet 650 mg  650 mg Oral Q6H PRN Andris Baumann, MD       Or   acetaminophen (TYLENOL) suppository 650 mg  650 mg Rectal Q6H PRN Andris Baumann, MD       aspirin EC tablet 81 mg  81 mg Oral Daily Lindajo Royal V, MD   81 mg at 03/12/23 0946   atorvastatin (LIPITOR) tablet 20 mg  20 mg Oral Daily Lindajo Royal V, MD   20 mg at 03/12/23 1120   enoxaparin  (LOVENOX) injection 40 mg  40 mg Subcutaneous Q24H Lindajo Royal V, MD   40 mg at 03/12/23 0947   HYDROcodone-acetaminophen (NORCO/VICODIN) 5-325 MG per tablet 1-2 tablet  1-2 tablet Oral Q4H PRN Andris Baumann, MD       insulin aspart (novoLOG) injection 0-5 Units  0-5 Units Subcutaneous QHS Lindajo Royal V, MD       insulin aspart (novoLOG) injection 0-9 Units  0-9 Units Subcutaneous TID WC Andris Baumann, MD   1 Units at 03/12/23 1219   meclizine (ANTIVERT) tablet 12.5 mg  12.5 mg Oral TID PRN Andris Baumann, MD       metoprolol succinate (TOPROL-XL) 24 hr tablet 25 mg  25 mg Oral QHS Lindajo Royal V, MD   25 mg at 03/12/23 0227   ondansetron (ZOFRAN) tablet 4 mg  4 mg Oral Q6H PRN Andris Baumann, MD       Or   ondansetron [facility]) injection 4 mg  4 mg Intravenous Q6H PRN Andris Baumann, MD       pantoprazole (PROTONIX) EC tablet 40 mg  40 mg Oral  Daily Andris Baumann, MD   40 mg at 03/12/23 0946   PARoxetine (PAXIL) tablet 40 mg  40 mg Oral Daily Lindajo Royal V, MD   40 mg at 03/12/23 1120   polyethylene glycol (MIRALAX / GLYCOLAX) packet 17 g  17 g Oral Daily Andris Baumann, MD   17 g at 03/12/23 4034   Current Outpatient Medications  Medication Sig Dispense Refill   acetaminophen (TYLENOL) 500 MG tablet Take 500 mg by mouth every 6 (six) hours as needed.     aspirin EC 81 MG tablet Take 81 mg by mouth daily. Swallow whole.     atorvastatin (LIPITOR) 20 MG tablet Take 20 mg by mouth daily.     carbamide peroxide (DEBROX) 6.5 % OTIC solution Place 5-10 drops into both ears 2 (two) times daily as needed (Ear wax removal).     clotrimazole (LOTRIMIN) 1 % cream Apply 1 application topically 2 (two) times daily.     fexofenadine (ALLEGRA) 180 MG tablet Take 180 mg by mouth daily.     fluticasone (FLONASE) 50 MCG/ACT nasal spray Place 2 sprays into both nostrils daily.     hydrocortisone cream 1 % Apply 1 application topically 2 (two) times daily as needed for itching.     metFORMIN  (GLUCOPHAGE) 500 MG tablet Take 500 mg by mouth 2 (two) times daily with a meal.     metoprolol succinate (TOPROL-XL) 25 MG 24 hr tablet TAKE 1 TABLET BY MOUTH AT BEDTIME 90 tablet 3   Multiple Vitamin (MULTIVITAMIN) capsule Take 1 capsule by mouth daily.     olopatadine (PATANOL) 0.1 % ophthalmic solution Place 1 drop into both eyes daily as needed for allergies.     omeprazole (PRILOSEC) 20 MG capsule Take 40 mg by mouth daily.     PARoxetine (PAXIL) 40 MG tablet Take 40 mg by mouth daily.     polyethylene glycol (MIRALAX / GLYCOLAX) 17 g packet Take 17 g by mouth daily as needed.     pseudoephedrine (SUDAFED) 60 MG tablet Take 60 mg by mouth every 6 (six) hours as needed.     Sodium Fluoride 1.1 % PSTE Place 1 Application onto teeth at bedtime.     ipratropium (ATROVENT) 0.03 % nasal spray Place 2 sprays into both nostrils every 12 (twelve) hours. (Patient not taking: Reported on 03/12/2023) 30 mL 1      Allergies: No Known Allergies    Past Medical History: Past Medical History:  Diagnosis Date   Allergic rhinitis    Diabetes mellitus without complication (HCC)    Pt takes Metformin   Diastolic dysfunction    a. 04/2021 Echo: EF 55-60%, no rwma, GrI DD, Nl RV fxn, RVSP 19.56mmHg. Mild MR/AI.   Gingival hyperplasia    Hyperlipidemia    Hypertension    Mental retardation    Obesity    PAF (paroxysmal atrial fibrillation) (HCC)    a. 02/2011 Developed AF in the setting of PE following pancreatic mass excision.  No known recurrence -->CHA2DS2VASc = 3.   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   PSVT (paroxysmal supraventricular tachycardia) (HCC)    a. 03/2021 Seen in ED for PSVT.   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   [facility]    TONSILLECTOMY       Family History: Family History  Problem Relation Age of Onset   Heart disease Father    Breast cancer Neg Hx      Social History: Social History   Socioeconomic History   Marital status: Single    Spouse name: Not on file   Number of children: Not on file   Years of education: Not on file   Highest education level: Not on file  Occupational History   Not on file  Tobacco Use   Smoking status: Never   Smokeless tobacco: Never  Vaping Use   Vaping status: Not on file  Substance and Sexual Activity   Alcohol use: No   Drug use: Never   Sexual activity: Not on file  Other Topics Concern   Not on file  Social History Narrative   Not on file   Social Determinants of Health   Financial Resource Strain: Not on file  Food Insecurity: Not on file  Transportation Needs: Not on file  Physical Activity: Not on file  Stress: Not on file  Social Connections: Not on file  Intimate Partner Violence: Not on file     Review of Systems: Review of Systems  Constitutional:  Negative for chills, fever and malaise/fatigue.  HENT:  Negative for congestion, sore throat and tinnitus.   Eyes:  Negative for blurred vision and redness.  Respiratory:  Negative for cough, shortness of breath and wheezing.   Cardiovascular:  Negative for chest pain, palpitations, claudication and leg swelling.  Gastrointestinal:  Positive for constipation. Negative for abdominal pain, diarrhea, nausea and vomiting.  Genitourinary:  Negative for flank pain, frequency and hematuria.  Musculoskeletal:  Negative for back pain, falls and myalgias.  Skin:  Negative for rash.  Neurological:  Positive for dizziness and headaches. Negative for weakness.  Endo/Heme/Allergies:  Does not bruise/bleed easily.  Psychiatric/Behavioral:  Negative for depression. The patient is not nervous/anxious and does not have insomnia.      Vital Signs: Blood pressure (!) 148/86, pulse 67, temperature 98.5 F (36.9 C), temperature source Oral, resp. rate 17, height 5\' 6"  (1.676 m), weight 61.2 kg, SpO2 97%.  Weight trends: Filed Weights   03/11/23 1947  Weight: 61.2 kg    Physical Exam: General: NAD  Head: Normocephalic, atraumatic. Moist oral mucosal membranes  Eyes: Anicteric  Lungs:  Clear to auscultation, normal effort  Heart: Regular rate and rhythm  Abdomen:  Soft, nontender, nondistended  Extremities: No peripheral edema.  Neurologic: Alert, moving all four extremities  Skin: No lesions  Access: None     Lab results: Basic Metabolic Panel: Recent Labs  Lab 03/11/23 1749 03/11/23 2337 03/12/23 0903  NA 127* 125* 132*  K 4.3 4.2 3.8  CL 91* 90* 96*  CO2 24 23 27   GLUCOSE 116* 133* 115*  BUN 17 17 14   CREATININE 1.22* 1.13* 1.04*  CALCIUM 9.1 8.6* 8.5*  MG  --  1.6*  --     Liver Function Tests: Recent Labs  Lab 03/11/23 2337  AST 70*  ALT 50*  ALKPHOS 93  BILITOT 0.5  PROT 7.3  ALBUMIN 3.6   Recent Labs  Lab 03/11/23 2337  LIPASE 31   No results for input(s): "AMMONIA" in the last 168 hours.  CBC: Recent Labs  Lab 03/11/23 1749 03/12/23 0903  WBC 3.7* 2.3*  NEUTROABS 2.4  --   HGB 13.3  12.4  HCT 39.9 37.5  MCV 84.2 83.9  PLT 238 206    Cardiac Enzymes: No results for input(s): "CKTOTAL", "CKMB", "CKMBINDEX", "TROPONINI" in the last 168 hours.  BNP: Invalid input(s): "POCBNP"  CBG: Recent Labs  Lab 03/12/23 0720 03/12/23 1159  GLUCAP 136* 142*    Microbiology: Results for orders placed or performed during the hospital encounter of 03/11/23  Resp panel by RT-PCR (RSV, Flu A&B, Covid) Urine, Clean Catch     Status: None   Collection Time: 03/11/23 11:37 PM   Specimen: Urine, Clean Catch; Nasal Swab  Result Value Ref Range Status   SARS Coronavirus 2 by RT PCR NEGATIVE NEGATIVE Final    Comment: (NOTE) SARS-CoV-2 target nucleic acids are NOT  DETECTED.  The SARS-CoV-2 RNA is generally detectable in upper respiratory specimens during the acute phase of infection. The lowest concentration of SARS-CoV-2 viral copies this assay can detect is 138 copies/mL. A negative result does not preclude SARS-Cov-2 infection and should not be used as the sole basis for treatment or other patient management decisions. A negative result may occur with  improper specimen collection/handling, submission of specimen other than nasopharyngeal swab, presence of viral mutation(s) within the areas targeted by this assay, and inadequate number of viral copies(<138 copies/mL). A negative result must be combined with clinical observations, patient history, and epidemiological information. The expected result is Negative.  Fact Sheet for Patients:  BloggerCourse.com  Fact Sheet for Healthcare Providers:  SeriousBroker.it  This test is no t yet approved or cleared by the [place] FDA and  has been authorized for detection and/or diagnosis of SARS-CoV-2 by FDA under an Emergency Use Authorization (EUA). This EUA will remain  in effect (meaning this test can be used) for the duration of the COVID-19 declaration under Section 564(b)(1) of the Act, 21 U.S.C.section 360bbb-3(b)(1), unless the authorization is terminated  or revoked sooner.       Influenza A by PCR NEGATIVE NEGATIVE Final   Influenza B by PCR NEGATIVE NEGATIVE Final    Comment: (NOTE) The Xpert Xpress SARS-CoV-2/FLU/RSV plus assay is intended as an aid in the diagnosis of influenza from Nasopharyngeal swab specimens and should not be used as a sole basis for treatment. Nasal washings and aspirates are unacceptable for Xpert Xpress SARS-CoV-2/FLU/RSV testing.  Fact Sheet for Patients: BloggerCourse.com  Fact Sheet for Healthcare Providers: SeriousBroker.it  This test is not yet  approved or cleared by the [place] FDA and has been authorized for detection and/or diagnosis of SARS-CoV-2 by FDA under an Emergency Use Authorization (EUA). This EUA will remain in effect (meaning this test can be used) for the duration of the COVID-19 declaration under Section 564(b)(1) of the Act, 21 U.S.C. section 360bbb-3(b)(1), unless the authorization is terminated or revoked.     Resp Syncytial Virus by PCR NEGATIVE NEGATIVE Final    Comment: (NOTE) Fact Sheet for Patients: BloggerCourse.com  Fact Sheet for Healthcare Providers: SeriousBroker.it  This test is not yet approved or cleared by the [place] FDA and has been authorized for detection and/or diagnosis of SARS-CoV-2 by FDA under an Emergency Use Authorization (EUA). This EUA will remain in effect (meaning this test can be used) for the duration of the COVID-19 declaration under Section 564(b)(1) of the Act, 21 U.S.C. section 360bbb-3(b)(1), unless the authorization is terminated or revoked.  Performed at [facility], [address] [place], [place] [address]     Coagulation Studies: No results for input(s): "LABPROT", "INR" in the  last 72 hours.  Urinalysis: Recent Labs    03/11/23 2337  COLORURINE YELLOW*  LABSPEC 1.042*  PHURINE 5.0  GLUCOSEU NEGATIVE  HGBUR MODERATE*  BILIRUBINUR NEGATIVE  KETONESUR 20*  PROTEINUR NEGATIVE  NITRITE NEGATIVE  LEUKOCYTESUR TRACE*      Imaging: MR BRAIN WO CONTRAST  Result Date: 03/12/2023 CLINICAL DATA:  Headache EXAM: MRI HEAD WITHOUT CONTRAST TECHNIQUE: Multiplanar, multiecho pulse sequences of the brain and surrounding structures were obtained without intravenous contrast. COMPARISON:  None Available. FINDINGS: Brain: No acute infarct, mass effect or extra-axial collection. No chronic microhemorrhage or siderosis. Minimal multifocal hyperintense T2-weight signal within the white matter.  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 The midline structures are normal. Vascular: Normal flow voids. Skull and upper cervical spine: Normal marrow signal. Sinuses/Orbits: Negative. Other: None. IMPRESSION: 1. No acute intracranial abnormality. 2. 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 Electronically Signed   By: Deatra Robinson M.D.   On: 03/12/2023 02:31   CT HEAD WO CONTRAST ( )  Result Date: 03/11/2023 CLINICAL DATA:  Headaches EXAM: CT HEAD WITHOUT CONTRAST TECHNIQUE: Contiguous axial images were obtained from the base of the skull through the vertex without intravenous contrast. RADIATION DOSE REDUCTION: This exam was performed according to the departmental dose-optimization program which includes automated exposure control, adjustment of the mA and/or kV according to patient size and/or use of iterative reconstruction technique. COMPARISON:  04/10/2021 FINDINGS: Brain: No evidence of acute infarction, hemorrhage, hydrocephalus, extra-axial collection or mass lesion/mass effect. Old right cerebellar infarct is noted and stable. Vascular: No hyperdense vessel or unexpected calcification. Skull: Normal. Negative for fracture or focal lesion. Sinuses/Orbits: No acute finding. Other: None. IMPRESSION: No acute intracranial abnormality noted. Electronically Signed   By: Alcide Clever M.D.   On: 03/11/2023 22:54   CT ABDOMEN PELVIS W CONTRAST  Result Date: 03/11/2023 CLINICAL DATA:  Acute abdominal pain for 2 days, initial encounter EXAM: CT ABDOMEN AND PELVIS WITH CONTRAST TECHNIQUE: Multidetector CT imaging of the abdomen and pelvis was performed using the standard protocol following bolus administration of intravenous contrast. RADIATION DOSE REDUCTION: This exam was performed according to the departmental dose-optimization program which includes automated exposure control, adjustment of the mA and/or kV according to patient size and/or use of iterative reconstruction technique. CONTRAST:   OMNIPAQUE IOHEXOL 300 MG/ML  SOLN COMPARISON:  05/06/2018 FINDINGS: Lower chest: No acute abnormality. Hepatobiliary: Fatty infiltration of the liver is noted. The gallbladder is within normal limits. Pancreas: Distal pancreatectomy is noted. The residual pancreas is within normal limits. Spleen: Surgically removed. A few small splenules are again identified. Adrenals/Urinary Tract: Adrenal glands are within normal limits. Kidneys demonstrate a normal enhancement pattern bilaterally. Simple cyst is noted in the lower pole of the right kidney. No follow-up is recommended. No calculi or obstructive changes are seen. The bladder is decompressed. Stomach/Bowel: Fecal material is noted throughout the colon consistent with a degree of constipation. No obstructive changes are noted. The appendix is within normal limits. Small bowel and stomach are unremarkable. Vascular/Lymphatic: Aortic atherosclerosis. No enlarged abdominal or pelvic lymph nodes. Reproductive: Status post hysterectomy. No adnexal masses. Other: No abdominal wall hernia or abnormality. No abdominopelvic ascites. Musculoskeletal: No acute or significant osseous findings. IMPRESSION: Postsurgical changes are noted. Changes of mild colonic constipation. No other acute abnormality is noted. Electronically Signed   By: Alcide Clever M.D.   On: 03/11/2023 22:53     Assessment & Plan: Briana Young is a 57 y.o.  female with past medical conditions  including diabetes, hypertension, hyperlipidemia, postop A-fib in 2012, intellectual disability, and chronic kidney disease stage III A, who was admitted to [facility] on 03/11/2023 for Acute headache [R51.9]  Hyponatremia, unclear etiology at this time.  No history of diuretics.  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  Sodium 127 on ED arrival, 125 this morning.  Has responded to IV fluids, 132 now.  Will continue to monitor sodium for now.  If needed, may order tolvaptan tomorrow.  Will order TSH, cortisol, and  uric acid.  Will continue to follow this patient.    LOS: 0 Melicia Esqueda 10/1/20241:12 PM

## 2023-03-12 NOTE — Assessment & Plan Note (Addendum)
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

## 2023-03-12 NOTE — Progress Notes (Addendum)
Nonbillable note Patient admitted to the hospital for evaluation of hyponatremia that appears to be secondary to SSRI (patient is on paroxetine).  Will consult nephrology for further recommendations.  Patient appears stable and headache has improved.

## 2023-03-12 NOTE — Assessment & Plan Note (Signed)
Patient is a resident of group home

## 2023-03-12 NOTE — Assessment & Plan Note (Signed)
Repleted in the ED with IV magnesium Will monitor

## 2023-03-12 NOTE — Assessment & Plan Note (Signed)
Renal function at baseline

## 2023-03-12 NOTE — Assessment & Plan Note (Signed)
Sliding scale insulin coverage 

## 2023-03-12 NOTE — H&P (Signed)
History and Physical    Patient: Briana Young ZOX:[id number] DOB: [birth year] DOA: 03/11/2023 DOS: the patient was seen and examined on 03/12/2023 PCP: Emogene Morgan, MD  Patient coming from:  group home  Chief Complaint:  Chief Complaint  Patient presents with   Abdominal Pain   Headache    HPI: Briana Young is a 57 y.o. female with medical history significant for PSVT on metoprolol, isolated episode of postop paroxysmal A-fib in 2012, no longer on Coumadin, DM, HTN and HLD, CKD 3a, intellectual disability and resident of a group home who was sent to the ED from urgent care with sudden onset headache and dizziness and constipation.  Staff member who provided history to the ED provider no longer here but patient had no other symptoms such as fever, cough, vomiting, diarrhea, chest pain or shortness of breath.  History is limited due to cognitive impairment amnestic and from review of records.  On evaluation at the urgent care, she had a mild leukopenia of 3.7 and sodium 127, with creatinine 1.22, COVID-negative, magnesium 1.6, AST/ALT 70/50 with normal bilirubin, normal lipase. ED course and data review: Tmax 100 with pulse in the 1 teens BP 146/88. Labs: CBC not repeated Repeat CMP showed sodium of 125, down from 127. Respiratory viral panel negative for COVID flu and RSV, urinalysis noninfectious.  EKG, personally viewed and interpreted showing NSR at 79 with no acute ST-T wave changes CT head nonacute CT abdomen and pelvis with fatty infiltration of the liver, past pancreatectomy and mild constipation Patient treated with an NS bolus, given magnesium supplementation and admission requested for symptomatic hyponatremia   Review of Systems: As mentioned in the history of present illness. All other systems reviewed and are negative.  Past Medical History:  Diagnosis Date   Allergic rhinitis    Diabetes mellitus without complication (HCC)    Pt takes Metformin   Diastolic dysfunction     a. 04/2021 Echo: EF 55-60%, no rwma, GrI DD, Nl RV fxn, RVSP 19.45mmHg. Mild MR/AI.   Gingival hyperplasia    Hyperlipidemia    Hypertension    Mental retardation    Obesity    PAF (paroxysmal atrial fibrillation) (HCC)    a. 02/2011 Developed AF in the setting of PE following pancreatic mass excision.  No known recurrence -->CHA2DS2VASc = 3.   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   PSVT (paroxysmal supraventricular tachycardia)    a. 03/2021 Seen in ED for PSVT.   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 She has never used smokeless tobacco. She reports that she does not drink alcohol and does not use drugs.  No Known Allergies  Family History  Problem Relation Age of Onset   Heart disease Father    Breast cancer Neg Hx     Prior to Admission medications   Medication Sig Start Date End Date Taking? Authorizing Provider  acetaminophen (TYLENOL) 500 MG tablet Take 500 mg by mouth every 6 (six) hours as needed.    [provider]  aspirin EC 81 MG tablet Take 81 mg by mouth daily. Swallow  whole.    [provider]  atorvastatin (LIPITOR) 20 MG tablet Take 20 mg by mouth daily. 06/11/18   [provider]  clotrimazole (LOTRIMIN) 1 % cream Apply 1 application topically 2 (two) times daily.    [provider]  fexofenadine (ALLEGRA) 180 MG tablet Take 180 mg by mouth daily.    [provider]  fluticasone (FLONASE) 50 MCG/ACT nasal spray Place 2 sprays into both nostrils daily.    [provider]  hydrocortisone cream 1 % Apply 1 application topically 2 (two) times  daily as needed for itching.    [provider]  ipratropium (ATROVENT) 0.03 % nasal spray Place 2 sprays into both nostrils every 12 (twelve) hours. 08/06/21   Rhys Martini, PA-C  metFORMIN (GLUCOPHAGE) 500 MG tablet Take 500 mg by mouth 2 (two) times daily with a meal.    [provider]  metoprolol succinate (TOPROL-XL) 25 MG 24 hr tablet TAKE 1 TABLET BY MOUTH AT BEDTIME 04/10/22   End, Cristal Deer, MD  Multiple Vitamin (MULTIVITAMIN) capsule Take 1 capsule by mouth daily.    [provider]  olopatadine (PATANOL) 0.1 % ophthalmic solution Place 1 drop into both eyes daily as needed for allergies.    [provider]  omeprazole (PRILOSEC) 20 MG capsule Take 40 mg by mouth daily.    [provider]  PARoxetine (PAXIL) 40 MG tablet Take 40 mg by mouth daily. 06/11/18   [provider]  polyethylene glycol (MIRALAX / GLYCOLAX) 17 g packet Take 17 g by mouth daily as needed.    [provider]    Physical Exam: Vitals:   03/11/23 2345 03/12/23 0000 03/12/23 0015 03/12/23 0045  BP:  (!) 156/93    Pulse: (!) 123 (!) 117 (!) 116 93  Resp:  20  17  Temp:      TempSrc:      SpO2: 100% 100% 100% 100%  Weight:       Physical Exam Vitals and nursing note reviewed.  Constitutional:      General: She is not in acute distress. HENT:     Head: Normocephalic and atraumatic.  Cardiovascular:     Rate and Rhythm: Normal rate and regular rhythm.     Heart sounds: Normal heart sounds.  Pulmonary:     Effort: Pulmonary effort is normal.     Breath sounds: Normal breath sounds.  Abdominal:     Palpations: Abdomen is soft.     Tenderness: There is no abdominal tenderness.  Neurological:     Mental Status: Mental status is at baseline.     Labs on Admission: I have personally reviewed following labs and imaging studies  CBC: Recent Labs  Lab 03/11/23 1749  WBC 3.7*  NEUTROABS 2.4  HGB 13.3  HCT 39.9  MCV 84.2  PLT 238    Basic Metabolic Panel: Recent Labs  Lab 03/11/23 1749 03/11/23 2337  NA 127* 125*  K 4.3 4.2  CL 91* 90*  CO2 24 23  GLUCOSE 116* 133*  BUN 17 17  CREATININE 1.22* 1.13*  CALCIUM 9.1 8.6*  MG  --  1.6*   GFR: CrCl cannot be calculated (Unknown ideal weight.). Liver Function Tests: Recent Labs  Lab 03/11/23 2337  AST 70*  ALT 50*  ALKPHOS 93  BILITOT 0.5  PROT 7.3  ALBUMIN 3.6   Recent Labs  Lab 03/11/23 2337  LIPASE 31   No results for input(s): "AMMONIA" in the last 168  hours. Coagulation Profile: No results for input(s): "INR", "PROTIME" in the last 168 hours. Cardiac Enzymes: No results for input(s): "CKTOTAL", "CKMB", "CKMBINDEX", "TROPONINI" in the last 168 hours. BNP (last 3 results) No results for input(s): "PROBNP" in the last 8760 hours. HbA1C: No results for input(s): "HGBA1C" in the last 72 hours. CBG: No results for input(s): "GLUCAP" in the last 168 hours. Lipid Profile: No results for input(s): "CHOL", "HDL", "LDLCALC", "TRIG", "CHOLHDL", "LDLDIRECT" in the last 72 hours. Thyroid Function Tests: No results for input(s): "TSH", "T4TOTAL", "FREET4", "T3FREE", "THYROIDAB" in the last 72 hours. Anemia Panel: No results for input(s): "VITAMINB12", "FOLATE", "FERRITIN", "TIBC", "IRON", "RETICCTPCT" in the last 72 hours. Urine analysis:    Component Value Date/Time   COLORURINE YELLOW (A) 03/11/2023 2337   APPEARANCEUR CLEAR (A) 03/11/2023 2337   LABSPEC 1.042 (H) 03/11/2023 2337   PHURINE 5.0 03/11/2023 2337   GLUCOSEU NEGATIVE 03/11/2023 2337   HGBUR MODERATE (A) 03/11/2023 2337   BILIRUBINUR NEGATIVE 03/11/2023 2337   KETONESUR 20 (A) 03/11/2023 2337   PROTEINUR NEGATIVE 03/11/2023 2337   NITRITE NEGATIVE 03/11/2023 2337   LEUKOCYTESUR TRACE (A) 03/11/2023 2337    Radiological Exams on Admission: CT HEAD WO CONTRAST ( )  Result Date: 03/11/2023 CLINICAL DATA:  Headaches EXAM: CT HEAD WITHOUT CONTRAST TECHNIQUE: Contiguous axial  images were obtained from the base of the skull through the vertex without intravenous contrast. RADIATION DOSE REDUCTION: This exam was performed according to the departmental dose-optimization program which includes automated exposure control, adjustment of the mA and/or kV according to patient size and/or use of iterative reconstruction technique. COMPARISON:  04/10/2021 FINDINGS: Brain: No evidence of acute infarction, hemorrhage, hydrocephalus, extra-axial collection or mass lesion/mass effect. Old right cerebellar infarct is noted and stable. Vascular: No hyperdense vessel or unexpected calcification. Skull: Normal. Negative for fracture or focal lesion. Sinuses/Orbits: No acute finding. Other: None. IMPRESSION: No acute intracranial abnormality noted. Electronically Signed   By: Alcide Clever M.D.   On: 03/11/2023 22:54   CT ABDOMEN PELVIS W CONTRAST  Result Date: 03/11/2023 CLINICAL DATA:  Acute abdominal pain for 2 days, initial encounter EXAM: CT ABDOMEN AND PELVIS WITH CONTRAST TECHNIQUE: Multidetector CT imaging of the abdomen and pelvis was performed using the standard protocol following bolus administration of intravenous contrast. RADIATION DOSE REDUCTION: This exam was performed according to the departmental dose-optimization program which includes automated exposure control, adjustment of the mA and/or kV according to patient size and/or use of iterative reconstruction technique. CONTRAST:  OMNIPAQUE IOHEXOL 300 MG/ML  SOLN COMPARISON:  05/06/2018 FINDINGS: Lower chest: No acute abnormality. Hepatobiliary: Fatty infiltration of the liver is noted. The gallbladder is within normal limits. Pancreas: Distal pancreatectomy is noted. The residual pancreas is within normal limits. Spleen: Surgically removed. A few small splenules are again identified. Adrenals/Urinary Tract: Adrenal glands are within normal limits. Kidneys demonstrate a normal enhancement pattern bilaterally. Simple cyst is noted  in the lower pole of the right kidney. No follow-up is recommended. No calculi or obstructive changes are seen. The bladder is decompressed. Stomach/Bowel: Fecal material is noted throughout the colon consistent with a degree of constipation. No obstructive changes are noted. The appendix is within normal limits. Small bowel and stomach are unremarkable. Vascular/Lymphatic: Aortic atherosclerosis. No enlarged abdominal or pelvic lymph nodes. Reproductive: Status post hysterectomy. No adnexal masses. Other: No abdominal wall hernia or abnormality. No abdominopelvic ascites. Musculoskeletal: No acute or significant osseous findings. IMPRESSION: Postsurgical changes are noted. Changes of mild colonic constipation. No other acute  abnormality is noted. Electronically Signed   By: Alcide Clever M.D.   On: 03/11/2023 22:53     Data Reviewed: Relevant notes from primary care and specialist visits, past discharge summaries as available in EHR, including Care Everywhere. 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  Not on systemic anticoagulation Will keep on cardiac monitor given headache and dizziness  Constipation Seen on abdominal CT Daily MiraLAX  Hypomagnesemia Repleted in the ED with IV magnesium Will monitor  Diabetes mellitus without complication (HCC) Sliding scale insulin coverage  Stage 3a chronic kidney disease (HCC) Renal function at baseline  Intellectual disability Patient is a resident of group home  History of removal of pancreatic cyst 2012 No acute issues.  Residual pancreas appears normal on CT     DVT prophylaxis: Lovenox  Consults: none  Advance Care Planning: dull code  Family Communication: none  Disposition Plan: Back to previous home environment  Severity of Illness: The appropriate patient status for this patient is OBSERVATION. Observation status is judged to be reasonable and necessary in order to provide the required intensity of service to ensure the patient's safety. The patient's presenting symptoms, physical exam findings, and initial radiographic and laboratory data in the context of their medical condition is felt to place them at decreased risk for further clinical deterioration. Furthermore, it is anticipated that the patient will be medically stable for discharge from the hospital within 2 midnights of admission.   Author: Andris Baumann, MD 03/12/2023 12:59 AM  For on call review [email or web address].

## 2023-03-12 NOTE — Assessment & Plan Note (Signed)
Uncertain etiology Got an NS bolus in the ED Will continue NS infusion Follow serum osmolality urine sodium and urine osmolality

## 2023-03-13 DIAGNOSIS — N1831 Chronic kidney disease, stage 3a: Secondary | ICD-10-CM

## 2023-03-13 DIAGNOSIS — E871 Hypo-osmolality and hyponatremia: Secondary | ICD-10-CM | POA: Diagnosis not present

## 2023-03-13 DIAGNOSIS — K5900 Constipation, unspecified: Secondary | ICD-10-CM

## 2023-03-13 DIAGNOSIS — F79 Unspecified intellectual disabilities: Secondary | ICD-10-CM

## 2023-03-13 DIAGNOSIS — R519 Headache, unspecified: Secondary | ICD-10-CM | POA: Diagnosis not present

## 2023-03-13 DIAGNOSIS — E119 Type 2 diabetes mellitus without complications: Secondary | ICD-10-CM

## 2023-03-13 LAB — CBC
HCT: 37.3 % (ref 36.0–46.0)
Hemoglobin: 12.3 g/dL (ref 12.0–15.0)
MCH: 27.6 pg (ref 26.0–34.0)
MCHC: 33 g/dL (ref 30.0–36.0)
MCV: 83.6 fL (ref 80.0–100.0)
Platelets: 208 10*3/uL (ref 150–400)
RBC: 4.46 MIL/uL (ref 3.87–5.11)
RDW: 13.7 % (ref 11.5–15.5)
WBC: 2.2 10*3/uL — ABNORMAL LOW (ref 4.0–10.5)
nRBC: 0 % (ref 0.0–0.2)

## 2023-03-13 LAB — BASIC METABOLIC PANEL
Anion gap: 7 (ref 5–15)
BUN: 11 mg/dL (ref 6–20)
CO2: 28 mmol/L (ref 22–32)
Calcium: 8.5 mg/dL — ABNORMAL LOW (ref 8.9–10.3)
Chloride: 97 mmol/L — ABNORMAL LOW (ref 98–111)
Creatinine, Ser: 1.04 mg/dL — ABNORMAL HIGH (ref 0.44–1.00)
GFR, Estimated: >60 mL/min (ref >60)
Glucose, Bld: 127 mg/dL — ABNORMAL HIGH (ref 70–99)
Potassium: 3.5 mmol/L (ref 3.5–5.1)
Sodium: 132 mmol/L — ABNORMAL LOW (ref 135–145)

## 2023-03-13 LAB — CBG MONITORING, ED
Glucose-Capillary: 124 mg/dL — ABNORMAL HIGH (ref 70–99)
Glucose-Capillary: 174 mg/dL — ABNORMAL HIGH (ref 70–99)

## 2023-03-13 NOTE — TOC Transition Note (Signed)
Transition of Care [facility]) - CM/SW Discharge Note   Patient Details  Name: Briana Young MRN: [id number] Date of Birth: July 20, 1965  Transition of Care [facility]) CM/SW Contact:  Margarito Liner, LCSW Phone Number: 03/13/2023, 3:56 PM   Clinical Narrative:   Patient has orders to return to [facility] today. CSW faxed FL2 and discharge summary to facility and RN will provide a hard copy to the staff member coming to pick her up. TOC coworker did update patient's legal guardian prior to discharge. No further concerns. CSW signing off.  Final next level of care: Group Home Barriers to Discharge: Barriers Resolved   Patient Goals and CMS Choice      Discharge Placement                  Patient to be transferred to facility by: [facility] staff   Patient and family notified of of transfer: 03/13/23  Discharge Plan and Services Additional resources added to the After Visit Summary for                                       Social Determinants of Health (SDOH) Interventions SDOH Screenings   Food Insecurity: No Food Insecurity (03/13/2023)  Housing: Low Risk  (03/13/2023)  Transportation Needs: No Transportation Needs (03/13/2023)  Utilities: Not At Risk (03/13/2023)  Tobacco Use: Low Risk  (03/11/2023)     Readmission Risk Interventions     No data to display

## 2023-03-13 NOTE — TOC Initial Note (Signed)
Transition of Care [facility]) - Initial/Assessment Note    Patient Details  Name: Briana Young MRN: [id number] Date of Birth: 1965-07-31  Transition of Care [facility]) CM/SW Contact:    Marquita Palms, LCSW Phone Number: 03/13/2023, 2:42 PM  Clinical Narrative:                  CSW phoned Lupita Leash from the group home and is awaiting a call back from her concerning her discharge.        Patient Goals and CMS Choice            Expected Discharge Plan and Services         Expected Discharge Date: 03/13/23                                    Prior Living Arrangements/Services                       Activities of Daily Living   ADL Screening (condition at time of admission) Independently performs ADLs?: No Does the patient have a NEW difficulty with bathing/dressing/toileting/self-feeding that is expected to last >3 days?: No Does the patient have a NEW difficulty with getting in/out of bed, walking, or climbing stairs that is expected to last >3 days?: No Does the patient have a NEW difficulty with communication that is expected to last >3 days?: No Is the patient deaf or have difficulty hearing?: No Does the patient have difficulty seeing, even when wearing glasses/contacts?: No Does the patient have difficulty concentrating, remembering, or making decisions?: No  Permission Sought/Granted                  Emotional Assessment              Admission diagnosis:  Acute headache [R51.9] Patient Active Problem List   Diagnosis Date Noted   Headache 03/12/2023   Dizziness 03/12/2023   Acute headache 03/12/2023   Constipation 03/12/2023   Hypomagnesemia 03/12/2023   Stage 3a chronic kidney disease (HCC) 03/12/2023   History of removal of pancreatic cyst 2012 03/12/2023   Hyponatremia 03/12/2023   Intellectual disability 03/12/2023   Diabetes mellitus without complication (HCC)    Palpitations 02/14/2022   Hypertension associated with type 2  diabetes mellitus (HCC) 02/14/2022   PSVT (paroxysmal supraventricular tachycardia) (HCC) 03/29/2011   Hyperlipidemia 03/29/2011   Pulmonary embolism (HCC) 03/29/2011   Pancreatic disorder 03/29/2011   PCP:  Emogene Morgan, MD Pharmacy:   [facility] [place], [place] - [address] [place] [address] Phone: [phone] Fax: [phone]  [facility] [place], [place] - [address] [place] [address] Phone: [phone] Fax: [phone]     Social Determinants of Health (SDOH) Social History: SDOH Screenings   Food Insecurity: No Food Insecurity (03/13/2023)  Housing: Low Risk  (03/13/2023)  Transportation Needs: No Transportation Needs (03/13/2023)  Utilities: Not At Risk (03/13/2023)  Tobacco Use: Low Risk  (03/11/2023)   SDOH Interventions:     Readmission Risk Interventions     No data to display

## 2023-03-13 NOTE — Hospital Course (Addendum)
Taken from prior notes.   Briana Young is a 57 y.o. female with medical history significant for PSVT on metoprolol, isolated episode of postop paroxysmal A-fib in 2012, no longer on Coumadin, DM, HTN and HLD, CKD 3a, intellectual disability and resident of a group home who was sent to the ED from urgent care with sudden onset headache and dizziness and constipation.    History is limited due to cognitive impairment amnestic and from review of records.  On evaluation at the urgent care, she had a mild leukopenia of 3.7 and sodium 127, with creatinine 1.22, COVID-negative, magnesium 1.6, AST/ALT 70/50 with normal bilirubin, normal lipase.   On presenting to ED, vitals mostly stable, CMP with sodium of 125, respiratory viral panel negative for COVID, RSV.  UA noninfectious. CT head without any acute abnormality. CT abdomen and pelvis with fatty infiltration of liver, past pancreatectomy and mild constipation.  Patient received normal saline bolus was given magnesium and admitted for symptomatic hyponatremia.  Nephrology was also consulted.  10/2: Vital stable, TSH, a.m. cortisol and uric acid normal.  Sodium improved to 132 with some normal saline , likely multifactorial with taking Paxil and poor p.o. intake.  Patient should be encouraged to keep herself well-hydrated, preferably with rehydration fluid or can add some extra salt to diet.  Please discuss with primary care provider to see if we can take her off from Paxil, otherwise she will need a close monitoring of sodium.  She will likely have mild hyponatremia at baseline.  Patient's headache can be either due to some change in eyesight, migraine can be a possibility.  Patient need to follow-up with PCP and an eye doctor as outpatient to make sure there is no prescription changes in her vision or if she has a diagnosis of migraine.  CT head and MRI brain was without any acute abnormality.  Patient can use over-the-counter MiraLAX or Metamucil to  help with constipation.  Should keep herself well-hydrated and can also use stool softeners.  Patient will continue on current medications and need to have a close follow-up with her providers for further recommendations.

## 2023-03-13 NOTE — Progress Notes (Signed)
[facility]  ROUNDING NOTE   Subjective:   Patient laying in bed, alert and oriented Continues report poor oral intake  Sodium 132  Objective:  Vital signs in last 24 hours:  Temp:  [98.1 F (36.7 C)-98.5 F (36.9 C)] 98.1 F (36.7 C) (10/01 2140) Pulse Rate:  [59-84] 69 (10/02 1215) Resp:  [15-21] 15 (10/02 1215) BP: (146-163)/(73-99) 163/91 (10/02 1204) SpO2:  [95 %-99 %] 97 % (10/02 1215)  Weight change:  Filed Weights   03/11/23 1947  Weight: 61.2 kg    Intake/Output: I/O last 3 completed shifts: In: 2050 [I.V.:1000; IV Piggyback:1050] Out: -    Intake/Output this shift:  No intake/output data recorded.  Physical Exam: General: NAD  Head: Normocephalic, atraumatic. Moist oral mucosal membranes  Eyes: Anicteric  Lungs:  Clear to auscultation  Heart: Regular rate and rhythm  Abdomen:  Soft, nontender  Extremities: No peripheral edema.  Neurologic: Alert, moving all four extremities  Skin: No lesions  Access: None    Basic Metabolic Panel: Recent Labs  Lab 03/11/23 1749 03/11/23 2337 03/12/23 0903 03/13/23 0952  NA 127* 125* 132* 132*  K 4.3 4.2 3.8 3.5  CL 91* 90* 96* 97*  CO2 24 23 27 28   GLUCOSE 116* 133* 115* 127*  BUN 17 17 14 11   CREATININE 1.22* 1.13* 1.04* 1.04*  CALCIUM 9.1 8.6* 8.5* 8.5*  MG  --  1.6*  --   --     Liver Function Tests: Recent Labs  Lab 03/11/23 2337  AST 70*  ALT 50*  ALKPHOS 93  BILITOT 0.5  PROT 7.3  ALBUMIN 3.6   Recent Labs  Lab 03/11/23 2337  LIPASE 31   No results for input(s): "AMMONIA" in the last 168 hours.  CBC: Recent Labs  Lab 03/11/23 1749 03/12/23 0903 03/13/23 0952  WBC 3.7* 2.3* 2.2*  NEUTROABS 2.4  --   --   HGB 13.3 12.4 12.3  HCT 39.9 37.5 37.3  MCV 84.2 83.9 83.6  PLT 238 206 208    Cardiac Enzymes: No results for input(s): "CKTOTAL", "CKMB", "CKMBINDEX", "TROPONINI" in the last 168 hours.  BNP: Invalid input(s): "POCBNP"  CBG: Recent Labs  Lab  03/12/23 0720 03/12/23 1159 03/12/23 1634 03/12/23 2134 03/13/23 0745  GLUCAP 136* 142* 121* 126* 124*    Microbiology: Results for orders placed or performed during the hospital encounter of 03/11/23  Resp panel by RT-PCR (RSV, Flu A&B, Covid) Urine, Clean Catch     Status: None   Collection Time: 03/11/23 11:37 PM   Specimen: Urine, Clean Catch; Nasal Swab  Result Value Ref Range Status   SARS Coronavirus 2 by RT PCR NEGATIVE NEGATIVE Final    Comment: (NOTE) SARS-CoV-2 target nucleic acids are NOT DETECTED.  The SARS-CoV-2 RNA is generally detectable in upper respiratory specimens during the acute phase of infection. The lowest concentration of SARS-CoV-2 viral copies this assay can detect is 138 copies/mL. A negative result does not preclude SARS-Cov-2 infection and should not be used as the sole basis for treatment or other patient management decisions. A negative result may occur with  improper specimen collection/handling, submission of specimen other than nasopharyngeal swab, presence of viral mutation(s) within the areas targeted by this assay, and inadequate number of viral copies(<138 copies/mL). A negative result must be combined with clinical observations, patient history, and epidemiological information. The expected result is Negative.  Fact Sheet for Patients:  BloggerCourse.com  Fact Sheet for Healthcare Providers:  SeriousBroker.it  This test  is no t yet approved or cleared by the [place] and  has been authorized for detection and/or diagnosis of SARS-CoV-2 by FDA under an Emergency Use Authorization (EUA). This EUA will remain  in effect (meaning this test can be used) for the duration of the COVID-19 declaration under Section 564(b)(1) of the Act, 21 U.S.C.section 360bbb-3(b)(1), unless the authorization is terminated  or revoked sooner.       Influenza A by PCR NEGATIVE NEGATIVE Final    Influenza B by PCR NEGATIVE NEGATIVE Final    Comment: (NOTE) The Xpert Xpress SARS-CoV-2/FLU/RSV plus assay is intended as an aid in the diagnosis of influenza from Nasopharyngeal swab specimens and should not be used as a sole basis for treatment. Nasal washings and aspirates are unacceptable for Xpert Xpress SARS-CoV-2/FLU/RSV testing.  Fact Sheet for Patients: BloggerCourse.com  Fact Sheet for Healthcare Providers: SeriousBroker.it  This test is not yet approved or cleared by the [place] FDA and has been authorized for detection and/or diagnosis of SARS-CoV-2 by FDA under an Emergency Use Authorization (EUA). This EUA will remain in effect (meaning this test can be used) for the duration of the COVID-19 declaration under Section 564(b)(1) of the Act, 21 U.S.C. section 360bbb-3(b)(1), unless the authorization is terminated or revoked.     Resp Syncytial Virus by PCR NEGATIVE NEGATIVE Final    Comment: (NOTE) Fact Sheet for Patients: BloggerCourse.com  Fact Sheet for Healthcare Providers: SeriousBroker.it  This test is not yet approved or cleared by the [place] FDA and has been authorized for detection and/or diagnosis of SARS-CoV-2 by FDA under an Emergency Use Authorization (EUA). This EUA will remain in effect (meaning this test can be used) for the duration of the COVID-19 declaration under Section 564(b)(1) of the Act, 21 U.S.C. section 360bbb-3(b)(1), unless the authorization is terminated or revoked.  Performed at [facility], [address] [place], [place] [address]     Coagulation Studies: No results for input(s): "LABPROT", "INR" in the last 72 hours.  Urinalysis: Recent Labs    03/11/23 2337  COLORURINE YELLOW*  LABSPEC 1.042*  PHURINE 5.0  GLUCOSEU NEGATIVE  HGBUR MODERATE*  BILIRUBINUR NEGATIVE  KETONESUR 20*  PROTEINUR  NEGATIVE  NITRITE NEGATIVE  LEUKOCYTESUR TRACE*      Imaging: MR BRAIN WO CONTRAST  Result Date: 03/12/2023 CLINICAL DATA:  Headache EXAM: MRI HEAD WITHOUT CONTRAST TECHNIQUE: Multiplanar, multiecho pulse sequences of the brain and surrounding structures were obtained without intravenous contrast. COMPARISON:  None Available. FINDINGS: Brain: No acute infarct, mass effect or extra-axial collection. No chronic microhemorrhage or siderosis. Minimal multifocal hyperintense T2-weight signal within the white matter. 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 The midline structures are normal. Vascular: Normal flow voids. Skull and upper cervical spine: Normal marrow signal. Sinuses/Orbits: Negative. Other: None. IMPRESSION: 1. No acute intracranial abnormality. 2. 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 Electronically Signed   By: Deatra Robinson M.D.   On: 03/12/2023 02:31   CT HEAD WO CONTRAST ( )  Result Date: 03/11/2023 CLINICAL DATA:  Headaches EXAM: CT HEAD WITHOUT CONTRAST TECHNIQUE: Contiguous axial images were obtained from the base of the skull through the vertex without intravenous contrast. RADIATION DOSE REDUCTION: This exam was performed according to the departmental dose-optimization program which includes automated exposure control, adjustment of the mA and/or kV according to patient size and/or use of iterative reconstruction technique. COMPARISON:  04/10/2021 FINDINGS: Brain: No evidence of acute infarction, hemorrhage, hydrocephalus, extra-axial collection or mass lesion/mass effect. Old right  cerebellar infarct is noted and stable. Vascular: No hyperdense vessel or unexpected calcification. Skull: Normal. Negative for fracture or focal lesion. Sinuses/Orbits: No acute finding. Other: None. IMPRESSION: No acute intracranial abnormality noted. Electronically Signed   By: Alcide Clever M.D.   On: 03/11/2023 22:54   CT ABDOMEN PELVIS W CONTRAST  Result Date: 03/11/2023 CLINICAL DATA:   Acute abdominal pain for 2 days, initial encounter EXAM: CT ABDOMEN AND PELVIS WITH CONTRAST TECHNIQUE: Multidetector CT imaging of the abdomen and pelvis was performed using the standard protocol following bolus administration of intravenous contrast. RADIATION DOSE REDUCTION: This exam was performed according to the departmental dose-optimization program which includes automated exposure control, adjustment of the mA and/or kV according to patient size and/or use of iterative reconstruction technique. CONTRAST:  OMNIPAQUE IOHEXOL 300 MG/ML  SOLN COMPARISON:  05/06/2018 FINDINGS: Lower chest: No acute abnormality. Hepatobiliary: Fatty infiltration of the liver is noted. The gallbladder is within normal limits. Pancreas: Distal pancreatectomy is noted. The residual pancreas is within normal limits. Spleen: Surgically removed. A few small splenules are again identified. Adrenals/Urinary Tract: Adrenal glands are within normal limits. Kidneys demonstrate a normal enhancement pattern bilaterally. Simple cyst is noted in the lower pole of the right kidney. No follow-up is recommended. No calculi or obstructive changes are seen. The bladder is decompressed. Stomach/Bowel: Fecal material is noted throughout the colon consistent with a degree of constipation. No obstructive changes are noted. The appendix is within normal limits. Small bowel and stomach are unremarkable. Vascular/Lymphatic: Aortic atherosclerosis. No enlarged abdominal or pelvic lymph nodes. Reproductive: Status post hysterectomy. No adnexal masses. Other: No abdominal wall hernia or abnormality. No abdominopelvic ascites. Musculoskeletal: No acute or significant osseous findings. IMPRESSION: Postsurgical changes are noted. Changes of mild colonic constipation. No other acute abnormality is noted. Electronically Signed   By: Alcide Clever M.D.   On: 03/11/2023 22:53     Medications:     aspirin EC  81 mg Oral Daily   atorvastatin  20 mg Oral  Daily   enoxaparin (LOVENOX) injection  40 mg Subcutaneous Q24H   insulin aspart  0-5 Units Subcutaneous QHS   insulin aspart  0-9 Units Subcutaneous TID WC   metoprolol succinate  25 mg Oral QHS   pantoprazole  40 mg Oral Daily   PARoxetine  40 mg Oral Daily   polyethylene glycol  17 g Oral Daily   acetaminophen **OR** acetaminophen, HYDROcodone-acetaminophen, meclizine, ondansetron **OR** ondansetron (ZOFRAN) IV  Assessment/ Plan:  Ms. Briana Young is a 57 y.o.  female with past medical conditions including diabetes, hypertension, hyperlipidemia, postop A-fib in 2012, intellectual disability, and chronic kidney disease stage III A, who was admitted to [facility] on 03/11/2023 for Acute headache [R51.9]   Hyponatremia, unclear etiology at this time. No history of diuretics. 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 Sodium 127 on ED arrival, 125 this morning. Responded well to IVF. Sodium remains at 132. Will continue to monitor.    LOS: 1 Jamea Robicheaux 10/2/20241:20 PM

## 2023-03-13 NOTE — NC FL2 (Signed)
[place] MEDICAID FL2 LEVEL OF CARE FORM     IDENTIFICATION  Patient Name: Briana Young Birthdate: 02-21-1966 Sex: female Admission Date (Current Location): 03/11/2023  [facility] and [place] Number:  Chiropodist and Address:  [facility] [address], [place], [place] [address]      Provider Number: [id number]  Attending Physician Name and Address:  Arnetha Courser, MD  Relative Name and Phone Number:       Current Level of Care: Hospital Recommended Level of Care: [facility] Prior Approval Number:    Date Approved/Denied:   PASRR Number:    Discharge Plan: Other (Comment) (Family Care home)    Current Diagnoses: Patient Active Problem List   Diagnosis Date Noted   Headache 03/12/2023   Dizziness 03/12/2023   Acute headache 03/12/2023   Constipation 03/12/2023   Hypomagnesemia 03/12/2023   Stage 3a chronic kidney disease (HCC) 03/12/2023   History of removal of pancreatic cyst 2012 03/12/2023   Hyponatremia 03/12/2023   Intellectual disability 03/12/2023   Diabetes mellitus without complication (HCC)    Palpitations 02/14/2022   Hypertension associated with type 2 diabetes mellitus (HCC) 02/14/2022   PSVT (paroxysmal supraventricular tachycardia) (HCC) 03/29/2011   Hyperlipidemia 03/29/2011   Pulmonary embolism (HCC) 03/29/2011   Pancreatic disorder 03/29/2011    Orientation RESPIRATION BLADDER Height & Weight     Time, Situation, Place, Self  Normal   Weight: 135 lb (61.2 kg) Height:  5\' 6"  (167.6 cm)  BEHAVIORAL SYMPTOMS/MOOD NEUROLOGICAL BOWEL NUTRITION STATUS   (None)     Diet (Low Sodium Heart healthy)  AMBULATORY STATUS COMMUNICATION OF NEEDS Skin     Verbally Normal                       Personal Care Assistance Level of Assistance              Functional Limitations Info  Sight, Hearing, Speech Sight Info: Adequate Hearing Info: Adequate Speech Info: Adequate    SPECIAL CARE FACTORS  FREQUENCY                       Contractures Contractures Info: Not present    Additional Factors Info  Code Status, Allergies Code Status Info: FULL CODE Allergies Info: NKA           Current Medications (03/13/2023):  This is the current hospital active medication list Current Facility-Administered Medications  Medication Dose Route Frequency Provider Last Rate Last Admin   acetaminophen (TYLENOL) tablet 650 mg  650 mg Oral Q6H PRN Andris Baumann, MD   650 mg at 03/13/23 8119   Or   acetaminophen (TYLENOL) suppository 650 mg  650 mg Rectal Q6H PRN Andris Baumann, MD       aspirin EC tablet 81 mg  81 mg Oral Daily Lindajo Royal V, MD   81 mg at 03/13/23 1478   atorvastatin (LIPITOR) tablet 20 mg  20 mg Oral Daily Lindajo Royal V, MD   20 mg at 03/13/23 0938   enoxaparin (LOVENOX) injection 40 mg  40 mg Subcutaneous Q24H Lindajo Royal V, MD   40 mg at 03/13/23 0940   HYDROcodone-acetaminophen (NORCO/VICODIN) 5-325 MG per tablet 1-2 tablet  1-2 tablet Oral Q4H PRN Andris Baumann, MD       insulin aspart (novoLOG) injection 0-5 Units  0-5 Units Subcutaneous QHS Andris Baumann, MD       insulin aspart (  novoLOG) injection 0-9 Units  0-9 Units Subcutaneous TID WC Andris Baumann, MD   1 Units at 03/13/23 4401   meclizine (ANTIVERT) tablet 12.5 mg  12.5 mg Oral TID PRN Andris Baumann, MD       metoprolol succinate (TOPROL-XL) 24 hr tablet 25 mg  25 mg Oral QHS Lindajo Royal V, MD   25 mg at 03/12/23 2137   ondansetron (ZOFRAN) tablet 4 mg  4 mg Oral Q6H PRN Andris Baumann, MD       Or   ondansetron [facility]) injection 4 mg  4 mg Intravenous Q6H PRN Andris Baumann, MD   4 mg at 03/13/23 0754   pantoprazole (PROTONIX) EC tablet 40 mg  40 mg Oral Daily Andris Baumann, MD   40 mg at 03/13/23 0272   PARoxetine (PAXIL) tablet 40 mg  40 mg Oral Daily Lindajo Royal V, MD   40 mg at 03/13/23 1051   polyethylene glycol (MIRALAX / GLYCOLAX) packet 17 g  17 g Oral Daily Andris Baumann,  MD   17 g at 03/13/23 1051   Current Outpatient Medications  Medication Sig Dispense Refill   acetaminophen (TYLENOL) 500 MG tablet Take 500 mg by mouth every 6 (six) hours as needed.     aspirin EC 81 MG tablet Take 81 mg by mouth daily. Swallow whole.     atorvastatin (LIPITOR) 20 MG tablet Take 20 mg by mouth daily.     carbamide peroxide (DEBROX) 6.5 % OTIC solution Place 5-10 drops into both ears 2 (two) times daily as needed (Ear wax removal).     clotrimazole (LOTRIMIN) 1 % cream Apply 1 application topically 2 (two) times daily.     fexofenadine (ALLEGRA) 180 MG tablet Take 180 mg by mouth daily.     fluticasone (FLONASE) 50 MCG/ACT nasal spray Place 2 sprays into both nostrils daily.     hydrocortisone cream 1 % Apply 1 application topically 2 (two) times daily as needed for itching.     metFORMIN (GLUCOPHAGE) 500 MG tablet Take 500 mg by mouth 2 (two) times daily with a meal.     metoprolol succinate (TOPROL-XL) 25 MG 24 hr tablet TAKE 1 TABLET BY MOUTH AT BEDTIME 90 tablet 3   Multiple Vitamin (MULTIVITAMIN) capsule Take 1 capsule by mouth daily.     olopatadine (PATANOL) 0.1 % ophthalmic solution Place 1 drop into both eyes daily as needed for allergies.     omeprazole (PRILOSEC) 20 MG capsule Take 40 mg by mouth daily.     PARoxetine (PAXIL) 40 MG tablet Take 40 mg by mouth daily.     polyethylene glycol (MIRALAX / GLYCOLAX) 17 g packet Take 17 g by mouth daily as needed.     pseudoephedrine (SUDAFED) 60 MG tablet Take 60 mg by mouth every 6 (six) hours as needed.     Sodium Fluoride 1.1 % PSTE Place 1 Application onto teeth at bedtime.       Discharge Medications: STOP taking these medications     ipratropium 0.03 % nasal spray Commonly known as: ATROVENT           TAKE these medications     acetaminophen 500 MG tablet Commonly known as: TYLENOL Take 500 mg by mouth every 6 (six) hours as needed.    aspirin EC 81 MG tablet Take 81 mg by mouth daily. Swallow  whole.    atorvastatin 20 MG tablet Commonly known as: LIPITOR Take 20 mg by  mouth daily.    carbamide peroxide 6.5 % OTIC solution Commonly known as: DEBROX Place 5-10 drops into both ears 2 (two) times daily as needed (Ear wax removal).    clotrimazole 1 % cream Commonly known as: LOTRIMIN Apply 1 application topically 2 (two) times daily.    fexofenadine 180 MG tablet Commonly known as: ALLEGRA Take 180 mg by mouth daily.    fluticasone 50 MCG/ACT nasal spray Commonly known as: FLONASE Place 2 sprays into both nostrils daily.    hydrocortisone cream 1 % Apply 1 application topically 2 (two) times daily as needed for itching.    metFORMIN 500 MG tablet Commonly known as: GLUCOPHAGE Take 500 mg by mouth 2 (two) times daily with a meal.    metoprolol succinate 25 MG 24 hr tablet Commonly known as: TOPROL-XL TAKE 1 TABLET BY MOUTH AT BEDTIME    multivitamin capsule Take 1 capsule by mouth daily.    olopatadine 0.1 % ophthalmic solution Commonly known as: PATANOL Place 1 drop into both eyes daily as needed for allergies.    omeprazole 20 MG capsule Commonly known as: PRILOSEC Take 40 mg by mouth daily.    PARoxetine 40 MG tablet Commonly known as: PAXIL Take 40 mg by mouth daily.    polyethylene glycol 17 g packet Commonly known as: MIRALAX / GLYCOLAX Take 17 g by mouth daily as needed.    pseudoephedrine 60 MG tablet Commonly known as: SUDAFED Take 60 mg by mouth every 6 (six) hours as needed.    Sodium Fluoride 1.1 % Pste Place 1 Application onto teeth at bedtime.    Relevant Imaging Results:  Relevant Lab Results:   Additional Information SSN: [id number]  Marquita Palms, LCSW

## 2023-03-13 NOTE — Discharge Summary (Signed)
Physician Discharge Summary   Patient: Briana Young MRN: [id number] DOB: 1966-05-08  Admit date:     03/11/2023  Discharge date: 03/13/23  Discharge Physician: Arnetha Courser   PCP: Emogene Morgan, MD   Recommendations at discharge:  Please obtain CBC and BMP within a week Follow-up with primary care provider Follow-up with an eye doctor to make sure there is no change in vision which is causing these headaches. Patient has mild hyponatremia, likely secondary to SSRI, please see if we can wean her off. Please encourage increased salt intake.  Discharge Diagnoses: Principal Problem:   Acute headache Active Problems:   Hyponatremia   PSVT (paroxysmal supraventricular tachycardia) (HCC)   Constipation   Hypomagnesemia   Diabetes mellitus without complication (HCC)   Stage 3a chronic kidney disease (HCC)   Dizziness   History of removal of pancreatic cyst 2012   Intellectual disability   Hospital Course: Taken from prior notes.   Briana Young is a 57 y.o. female with medical history significant for PSVT on metoprolol, isolated episode of postop paroxysmal A-fib in 2012, no longer on Coumadin, DM, HTN and HLD, CKD 3a, intellectual disability and resident of a group home who was sent to the ED from urgent care with sudden onset headache and dizziness and constipation.    History is limited due to cognitive impairment amnestic and from review of records.  On evaluation at the urgent care, she had a mild leukopenia of 3.7 and sodium 127, with creatinine 1.22, COVID-negative, magnesium 1.6, AST/ALT 70/50 with normal bilirubin, normal lipase.   On presenting to ED, vitals mostly stable, CMP with sodium of 125, respiratory viral panel negative for COVID, RSV.  UA noninfectious. CT head without any acute abnormality. CT abdomen and pelvis with fatty infiltration of liver, past pancreatectomy and mild constipation.  Patient received normal saline bolus was given magnesium and admitted for  symptomatic hyponatremia.  Nephrology was also consulted.  10/2: Vital stable, TSH, a.m. cortisol and uric acid normal.  Sodium improved to 132 with some normal saline , likely multifactorial with taking Paxil and poor p.o. intake.  Patient should be encouraged to keep herself well-hydrated, preferably with rehydration fluid or can add some extra salt to diet.  Please discuss with primary care provider to see if we can take her off from Paxil, otherwise she will need a close monitoring of sodium.  She will likely have mild hyponatremia at baseline.  Patient's headache can be either due to some change in eyesight, migraine can be a possibility.  Patient need to follow-up with PCP and an eye doctor as outpatient to make sure there is no prescription changes in her vision or if she has a diagnosis of migraine.  CT head and MRI brain was without any acute abnormality.  Patient can use over-the-counter MiraLAX or Metamucil to help with constipation.  Should keep herself well-hydrated and can also use stool softeners.  Patient will continue on current medications and need to have a close follow-up with her providers for further recommendations.  Assessment and Plan: * Acute headache Dizziness CT head and MRI brain was negative for any acute abnormality.  Headaches do respond to Tylenol. Please keep her well-hydrated, follow-up with eye doctor to rule out any change in vision.  Hyponatremia Likely multifactorial with poor hydration and taking Paxil. Responded well to normal saline. Encourage increased salt intake and see if she can be weaned off from Paxil.  Should keep her well-hydrated  PSVT (paroxysmal supraventricular  tachycardia) (HCC) History of postoperative paroxysmal A-fib 2012 Follows with cardiology, last seen end of 2023 Continue metoprolol, aspirin.  Not on systemic anticoagulation Will keep on cardiac monitor given headache and dizziness  Constipation Seen on abdominal CT Daily  MiraLAX  Hypomagnesemia Repleted in the ED with IV magnesium Will monitor  Diabetes mellitus without complication (HCC) Sliding scale insulin coverage  Stage 3a chronic kidney disease (HCC) Renal function at baseline  Intellectual disability Patient is a resident of group home  History of removal of pancreatic cyst 2012 No acute issues.  Residual pancreas appears normal on CT   Consultants: Nephrology Procedures performed: None Disposition: Group home Diet recommendation:  Discharge Diet Orders (From admission, onward)     Start     Ordered   03/13/23 0000  Diet - low sodium heart healthy        03/13/23 1255           Regular diet DISCHARGE MEDICATION: Allergies as of 03/13/2023   No Known Allergies      Medication List     STOP taking these medications    ipratropium 0.03 % nasal spray Commonly known as: ATROVENT       TAKE these medications    acetaminophen 500 MG tablet Commonly known as: TYLENOL Take 500 mg by mouth every 6 (six) hours as needed.   aspirin EC 81 MG tablet Take 81 mg by mouth daily. Swallow whole.   atorvastatin 20 MG tablet Commonly known as: LIPITOR Take 20 mg by mouth daily.   carbamide peroxide 6.5 % OTIC solution Commonly known as: DEBROX Place 5-10 drops into both ears 2 (two) times daily as needed (Ear wax removal).   clotrimazole 1 % cream Commonly known as: LOTRIMIN Apply 1 application topically 2 (two) times daily.   fexofenadine 180 MG tablet Commonly known as: ALLEGRA Take 180 mg by mouth daily.   fluticasone 50 MCG/ACT nasal spray Commonly known as: FLONASE Place 2 sprays into both nostrils daily.   hydrocortisone cream 1 % Apply 1 application topically 2 (two) times daily as needed for itching.   metFORMIN 500 MG tablet Commonly known as: GLUCOPHAGE Take 500 mg by mouth 2 (two) times daily with a meal.   metoprolol succinate 25 MG 24 hr tablet Commonly known as: TOPROL-XL TAKE 1 TABLET BY MOUTH  AT BEDTIME   multivitamin capsule Take 1 capsule by mouth daily.   olopatadine 0.1 % ophthalmic solution Commonly known as: PATANOL Place 1 drop into both eyes daily as needed for allergies.   omeprazole 20 MG capsule Commonly known as: PRILOSEC Take 40 mg by mouth daily.   PARoxetine 40 MG tablet Commonly known as: PAXIL Take 40 mg by mouth daily.   polyethylene glycol 17 g packet Commonly known as: MIRALAX / GLYCOLAX Take 17 g by mouth daily as needed.   pseudoephedrine 60 MG tablet Commonly known as: SUDAFED Take 60 mg by mouth every 6 (six) hours as needed.   Sodium Fluoride 1.1 % Pste Place 1 Application onto teeth at bedtime.        Follow-up Information     Aycock, Ngwe A, MD. Schedule an appointment as soon as possible for a visit in 1 week(s).   Specialty: Family Medicine Contact information: [address] [place] [address] [phone]                Discharge Exam: Filed Weights   03/11/23 1947  Weight: 61.2 kg   General.  Cognitively impaired lady, in no acute distress. Pulmonary.  Lungs clear bilaterally, normal respiratory effort. CV.  Regular rate and rhythm, no JVD, rub or murmur. Abdomen.  Soft, nontender, nondistended, BS positive. CNS.  Alert and oriented .  No focal neurologic deficit. Extremities.  No edema, no cyanosis, pulses intact and symmetrical.  Condition at discharge: stable  The results of significant diagnostics from this hospitalization (including imaging, microbiology, ancillary and laboratory) are listed below for reference.   Imaging Studies: MR BRAIN WO CONTRAST  Result Date: 03/12/2023 CLINICAL DATA:  Headache EXAM: MRI HEAD WITHOUT CONTRAST TECHNIQUE: Multiplanar, multiecho pulse sequences of the brain and surrounding structures were obtained without intravenous contrast. COMPARISON:  None Available. FINDINGS: Brain: No acute infarct, mass effect or extra-axial collection. No chronic microhemorrhage  or siderosis. Minimal multifocal hyperintense T2-weight signal within the white matter. 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 The midline structures are normal. Vascular: Normal flow voids. Skull and upper cervical spine: Normal marrow signal. Sinuses/Orbits: Negative. Other: None. IMPRESSION: 1. No acute intracranial abnormality. 2. 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 Electronically Signed   By: Deatra Robinson M.D.   On: 03/12/2023 02:31   CT HEAD WO CONTRAST ( )  Result Date: 03/11/2023 CLINICAL DATA:  Headaches EXAM: CT HEAD WITHOUT CONTRAST TECHNIQUE: Contiguous axial images were obtained from the base of the skull through the vertex without intravenous contrast. RADIATION DOSE REDUCTION: This exam was performed according to the departmental dose-optimization program which includes automated exposure control, adjustment of the mA and/or kV according to patient size and/or use of iterative reconstruction technique. COMPARISON:  04/10/2021 FINDINGS: Brain: No evidence of acute infarction, hemorrhage, hydrocephalus, extra-axial collection or mass lesion/mass effect. Old right cerebellar infarct is noted and stable. Vascular: No hyperdense vessel or unexpected calcification. Skull: Normal. Negative for fracture or focal lesion. Sinuses/Orbits: No acute finding. Other: None. IMPRESSION: No acute intracranial abnormality noted. Electronically Signed   By: Alcide Clever M.D.   On: 03/11/2023 22:54   CT ABDOMEN PELVIS W CONTRAST  Result Date: 03/11/2023 CLINICAL DATA:  Acute abdominal pain for 2 days, initial encounter EXAM: CT ABDOMEN AND PELVIS WITH CONTRAST TECHNIQUE: Multidetector CT imaging of the abdomen and pelvis was performed using the standard protocol following bolus administration of intravenous contrast. RADIATION DOSE REDUCTION: This exam was performed according to the departmental dose-optimization program which includes automated exposure control, adjustment of the mA and/or kV  according to patient size and/or use of iterative reconstruction technique. CONTRAST:  OMNIPAQUE IOHEXOL 300 MG/ML  SOLN COMPARISON:  05/06/2018 FINDINGS: Lower chest: No acute abnormality. Hepatobiliary: Fatty infiltration of the liver is noted. The gallbladder is within normal limits. Pancreas: Distal pancreatectomy is noted. The residual pancreas is within normal limits. Spleen: Surgically removed. A few small splenules are again identified. Adrenals/Urinary Tract: Adrenal glands are within normal limits. Kidneys demonstrate a normal enhancement pattern bilaterally. Simple cyst is noted in the lower pole of the right kidney. No follow-up is recommended. No calculi or obstructive changes are seen. The bladder is decompressed. Stomach/Bowel: Fecal material is noted throughout the colon consistent with a degree of constipation. No obstructive changes are noted. The appendix is within normal limits. Small bowel and stomach are unremarkable. Vascular/Lymphatic: Aortic atherosclerosis. No enlarged abdominal or pelvic lymph nodes. Reproductive: Status post hysterectomy. No adnexal masses. Other: No abdominal wall hernia or abnormality. No abdominopelvic ascites. Musculoskeletal: No acute or significant osseous findings. IMPRESSION: Postsurgical changes are noted. Changes of mild colonic constipation. No other acute abnormality is noted.  Electronically Signed   By: Alcide Clever M.D.   On: 03/11/2023 22:53    Microbiology: Results for orders placed or performed during the hospital encounter of 03/11/23  Resp panel by RT-PCR (RSV, Flu A&B, Covid) Urine, Clean Catch     Status: None   Collection Time: 03/11/23 11:37 PM   Specimen: Urine, Clean Catch; Nasal Swab  Result Value Ref Range Status   SARS Coronavirus 2 by RT PCR NEGATIVE NEGATIVE Final    Comment: (NOTE) SARS-CoV-2 target nucleic acids are NOT DETECTED.  The SARS-CoV-2 RNA is generally detectable in upper respiratory specimens during the acute  phase of infection. The lowest concentration of SARS-CoV-2 viral copies this assay can detect is 138 copies/mL. A negative result does not preclude SARS-Cov-2 infection and should not be used as the sole basis for treatment or other patient management decisions. A negative result may occur with  improper specimen collection/handling, submission of specimen other than nasopharyngeal swab, presence of viral mutation(s) within the areas targeted by this assay, and inadequate number of viral copies(<138 copies/mL). A negative result must be combined with clinical observations, patient history, and epidemiological information. The expected result is Negative.  Fact Sheet for Patients:  BloggerCourse.com  Fact Sheet for Healthcare Providers:  SeriousBroker.it  This test is no t yet approved or cleared by the [place] FDA and  has been authorized for detection and/or diagnosis of SARS-CoV-2 by FDA under an Emergency Use Authorization (EUA). This EUA will remain  in effect (meaning this test can be used) for the duration of the COVID-19 declaration under Section 564(b)(1) of the Act, 21 U.S.C.section 360bbb-3(b)(1), unless the authorization is terminated  or revoked sooner.       Influenza A by PCR NEGATIVE NEGATIVE Final   Influenza B by PCR NEGATIVE NEGATIVE Final    Comment: (NOTE) The Xpert Xpress SARS-CoV-2/FLU/RSV plus assay is intended as an aid in the diagnosis of influenza from Nasopharyngeal swab specimens and should not be used as a sole basis for treatment. Nasal washings and aspirates are unacceptable for Xpert Xpress SARS-CoV-2/FLU/RSV testing.  Fact Sheet for Patients: BloggerCourse.com  Fact Sheet for Healthcare Providers: SeriousBroker.it  This test is not yet approved or cleared by the [place] FDA and has been authorized for detection and/or diagnosis of  SARS-CoV-2 by FDA under an Emergency Use Authorization (EUA). This EUA will remain in effect (meaning this test can be used) for the duration of the COVID-19 declaration under Section 564(b)(1) of the Act, 21 U.S.C. section 360bbb-3(b)(1), unless the authorization is terminated or revoked.     Resp Syncytial Virus by PCR NEGATIVE NEGATIVE Final    Comment: (NOTE) Fact Sheet for Patients: BloggerCourse.com  Fact Sheet for Healthcare Providers: SeriousBroker.it  This test is not yet approved or cleared by the [place] FDA and has been authorized for detection and/or diagnosis of SARS-CoV-2 by FDA under an Emergency Use Authorization (EUA). This EUA will remain in effect (meaning this test can be used) for the duration of the COVID-19 declaration under Section 564(b)(1) of the Act, 21 U.S.C. section 360bbb-3(b)(1), unless the authorization is terminated or revoked.  Performed at [facility], [address] [place], [place] [address]     Labs: CBC: Recent Labs  Lab 03/11/23 1749 03/12/23 0903 03/13/23 0952  WBC 3.7* 2.3* 2.2*  NEUTROABS 2.4  --   --   HGB 13.3 12.4 12.3  HCT 39.9 37.5 37.3  MCV 84.2 83.9 83.6  PLT 238 206 208  Basic Metabolic Panel: Recent Labs  Lab 03/11/23 1749 03/11/23 2337 03/12/23 0903 03/13/23 0952  NA 127* 125* 132* 132*  K 4.3 4.2 3.8 3.5  CL 91* 90* 96* 97*  CO2 24 23 27 28   GLUCOSE 116* 133* 115* 127*  BUN 17 17 14 11   CREATININE 1.22* 1.13* 1.04* 1.04*  CALCIUM 9.1 8.6* 8.5* 8.5*  MG  --  1.6*  --   --    Liver Function Tests: Recent Labs  Lab 03/11/23 2337  AST 70*  ALT 50*  ALKPHOS 93  BILITOT 0.5  PROT 7.3  ALBUMIN 3.6   CBG: Recent Labs  Lab 03/12/23 0720 03/12/23 1159 03/12/23 1634 03/12/23 2134 03/13/23 0745  GLUCAP 136* 142* 121* 126* 124*    Discharge time spent: greater than 30 minutes.  This record has been created using Software engineer. Errors have been sought and corrected,but may not always be located. Such creation errors do not reflect on the standard of care.   Signed: Arnetha Courser, MD [facility] 03/13/2023

## 2023-04-10 ENCOUNTER — Encounter: Payer: Self-pay | Admitting: Internal Medicine

## 2023-04-10 ENCOUNTER — Ambulatory Visit: Payer: Medicare Other | Attending: Internal Medicine | Admitting: Internal Medicine

## 2023-04-10 VITALS — BP 124/76 | HR 65 | Ht 68.0 in | Wt 157.8 lb

## 2023-04-10 DIAGNOSIS — I471 Supraventricular tachycardia, unspecified: Secondary | ICD-10-CM | POA: Diagnosis present

## 2023-04-10 DIAGNOSIS — I480 Paroxysmal atrial fibrillation: Secondary | ICD-10-CM | POA: Insufficient documentation

## 2023-04-10 DIAGNOSIS — R42 Dizziness and giddiness: Secondary | ICD-10-CM | POA: Diagnosis present

## 2023-04-10 DIAGNOSIS — I152 Hypertension secondary to endocrine disorders: Secondary | ICD-10-CM | POA: Diagnosis present

## 2023-04-10 DIAGNOSIS — E871 Hypo-osmolality and hyponatremia: Secondary | ICD-10-CM | POA: Insufficient documentation

## 2023-04-10 DIAGNOSIS — R002 Palpitations: Secondary | ICD-10-CM | POA: Insufficient documentation

## 2023-04-10 DIAGNOSIS — E1159 Type 2 diabetes mellitus with other circulatory complications: Secondary | ICD-10-CM | POA: Insufficient documentation

## 2023-04-10 NOTE — Progress Notes (Signed)
Cardiology Office Note:  .   Date:  04/10/2023  ID:  Briana Young, DOB February 28, 1966, MRN [id number] PCP: Briana Morgan, MD  [facility] Providers Cardiologist:  Briana Kendall, MD     History of Present Illness: .   Briana Young is a 57 y.o. female with history of supraventricular tachycardia, paroxysmal atrial fibrillation that occurred in the setting of PE following pancreatic mass excision in 2012, hyperlipidemia, and type 2 diabetes mellitus, who presents for follow-up of SVT.  She was last seen in our office in 03/2022 by Carlos Levering, NP, for follow-up of palpitations and dizziness after metoprolol was changed.  Dizziness had resolved at her follow-up visit.  Occasional palpitations were unchanged.  No further medication changes or additional testing were pursued.  Today, Briana Young reports that she has been doing well.  She has stable sporadic palpitations and occasional orthostatic lightheadedness.  She has not passed out or fallen.  She denies chest pain, shortness of breath, and edema.  A caregiver, who accompanies [name] today, reports that her blood pressures are typically in the 120s over 70s.  Briana Young was hospitalized last month with hyponatremia in the setting of headache, dizziness, and constipation.  It was felt that her hyponatremia was due to a combination of poor oral intake and Paxil use.  She was encouraged to stay well-hydrated and speak with her PCP about weaning off Paxil (Paxil 40 mg daily remains on her medication list).  ROS: See HPI  Studies Reviewed: Marland Kitchen   EKG Interpretation Date/Time:  Wednesday April 10 2023 08:45:03 EDT Ventricular Rate:  65 PR Interval:  182 QRS Duration:  94 QT Interval:  400 QTC Calculation: 416 R Axis:   27  Text Interpretation: Normal sinus rhythm Normal ECG When compared with ECG of 12-Mar-2023 00:58, Normal sinus rhythm has replaced Probable Supraventricular tachycardia Confirmed by Kaytlynn Kochan ([id number]) on  04/10/2023 9:49:54 AM    TTE (05/09/2021):  1. Left ventricular ejection fraction, by estimation, is 55 to 60%. The  left ventricle has normal function. The left ventricle has no regional  wall motion abnormalities. Left ventricular diastolic parameters are  consistent with Grade I diastolic  dysfunction (impaired relaxation).   2. Right ventricular systolic function is normal. The right ventricular  size is normal. There is normal pulmonary artery systolic pressure. The  estimated right ventricular systolic pressure is 19.9 mmHg.   3. The mitral valve is normal in structure. Mild mitral valve  regurgitation. No evidence of mitral stenosis.   4. The aortic valve is normal in structure. Aortic valve regurgitation is  mild. No aortic stenosis is present.   5. The inferior vena cava is normal in size with greater than 50%  respiratory variability, suggesting right atrial pressure of 3 mmHg.   Risk Assessment/Calculations:             Physical Exam:   VS:  BP 124/76 (BP Location: Left Arm, Patient Position: Sitting, Cuff Size: Normal)   Pulse 65   Ht 5\' 8"  (1.727 m)   Wt 157 lb 12.8 oz (71.6 kg)   SpO2 100%   BMI 23.99 kg/m    Wt Readings from Last 3 Encounters:  04/10/23 157 lb 12.8 oz (71.6 kg)  03/11/23 135 lb (61.2 kg)  03/11/23 135 lb (61.2 kg)    General:  NAD.  Accompanied by a caregiver. Neck: No JVD or HJR. Lungs: Clear to auscultation bilaterally without wheezes or crackles. Heart: Regular rate and  rhythm without murmurs, rubs, or gallops. Abdomen: Soft, nontender, nondistended. Extremities: No lower extremity edema.  ASSESSMENT AND PLAN: .    Palpitations, PSVT, and dizziness: Symptoms are stable compared to last year.  We will continue her current dose of metoprolol succinate.  If dizziness worsens, on his own we should let us know so that we can consider de-escalation of her metoprolol.  Paroxysmal atrial fibrillation: Patient with only a single documented  episode in the setting of pulmonary embolism following pancreatic mass excision in 2012.  Defer further workup and initiation of anticoagulation.  Hyponatremia: Patient was hospitalized last month with hyponatremia felt to be due to a combination of dehydration and Paxil use.  She remains on Paxil.  Defer ongoing management (including deescalation/discontinuation of Paxil) to her PCP.  Hypertension associated with type 2 diabetes mellitus: Blood pressure well-controlled today.  Continue metoprolol succinate 25 mg daily.  Ongoing management of DM per PCP.    Dispo: Return to clinic in 1 year.  Signed, Briana Kendall, MD

## 2023-04-10 NOTE — Patient Instructions (Signed)
Medication Instructions:  No changes *If you need a refill on your cardiac medications before your next appointment, please call your pharmacy*   Lab Work: None ordered If you have labs (blood work) drawn today and your tests are completely normal, you will receive your results only by: MyChart Message (if you have MyChart) OR A paper copy in the mail If you have any lab test that is abnormal or we need to change your treatment, we will call you to review the results.   Testing/Procedures: None ordered   Follow-Up: At [facility], you and your health needs are our priority.  As part of our continuing mission to provide you with exceptional heart care, we have created designated Provider Care Teams.  These Care Teams include your primary Cardiologist (physician) and Advanced Practice Providers (APPs -  Physician Assistants and Nurse Practitioners) who all work together to provide you with the care you need, when you need it.  We recommend signing up for the patient portal called "MyChart".  Sign up information is provided on this After Visit Summary.  MyChart is used to connect with patients for Virtual Visits (Telemedicine).  Patients are able to view lab/test results, encounter notes, upcoming appointments, etc.  Non-urgent messages can be sent to your provider as well.   To learn more about what you can do with MyChart, go to [email or web address].    Your next appointment:   12 month(s)  Provider:   You may see Christopher End, MD or one of the following Advanced Practice Providers on your designated Care Team:   Christopher Berge, NP Ryan Dunn, PA-C Cadence Furth, PA-C Sheri Hammock, NP    

## 2023-05-01 ENCOUNTER — Other Ambulatory Visit: Payer: Self-pay | Admitting: Internal Medicine

## 2023-05-24 IMAGING — CT CT HEAD W/O CM
3 series · 16 of 47 positions shown, 19 images · non-contrast
Comparison: None.

CLINICAL DATA: Unwitnessed fall and syncope

EXAM:
CT HEAD WITHOUT CONTRAST
TECHNIQUE: Contiguous axial images were obtained from the base of the skull
through the vertex without intravenous contrast.

[Series 3: head wo · axial · 0.41mm/px · z∈[-66,+59]mm · 10 of 30 slices shown, 13 images]
[im 3/30  brain]
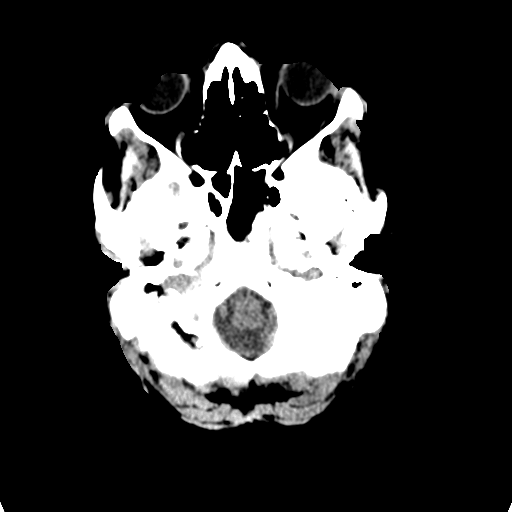
[im 3/30  bone]
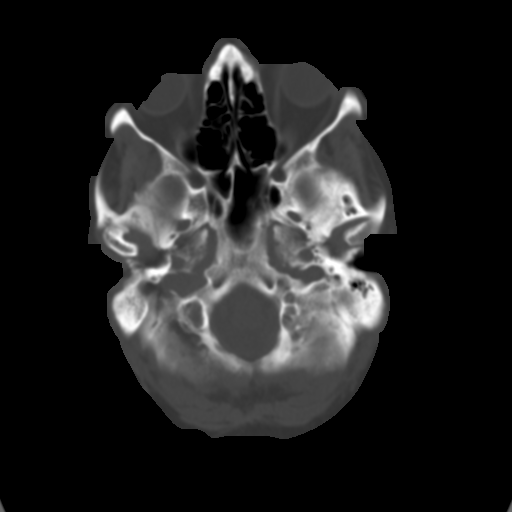
[im 6/30  brain]
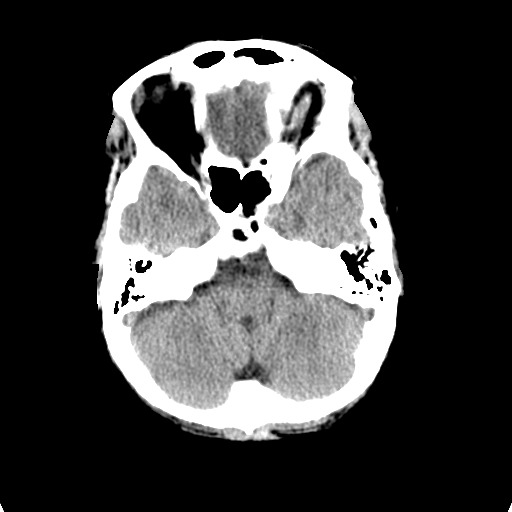
[im 9/30  brain]
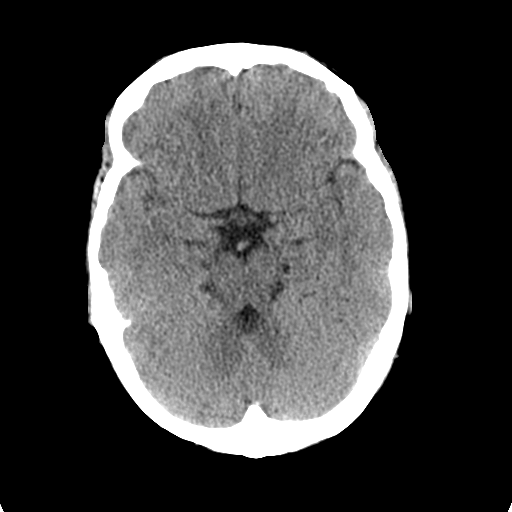
[im 11/30  brain]
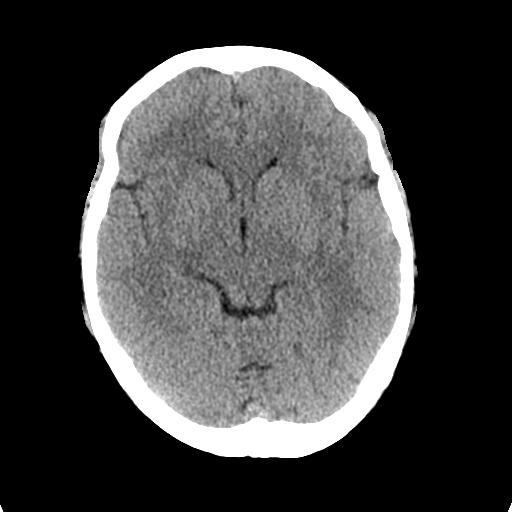
[im 14/30  brain]
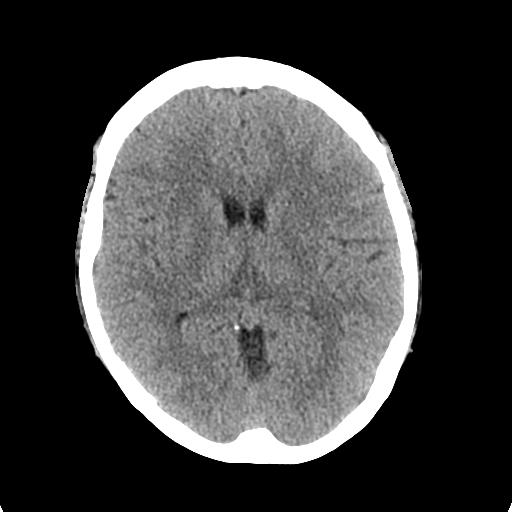
[im 14/30  bone]
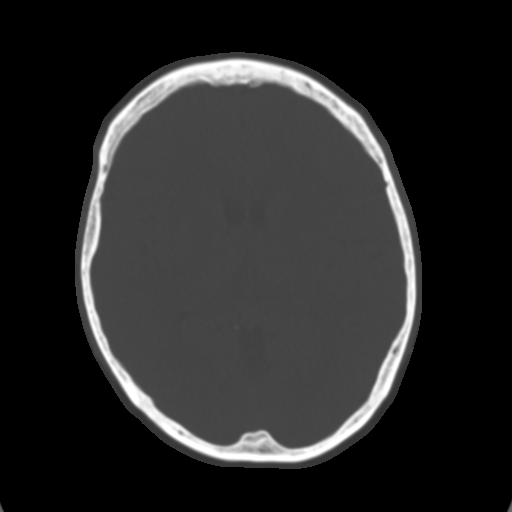
[im 17/30  brain]
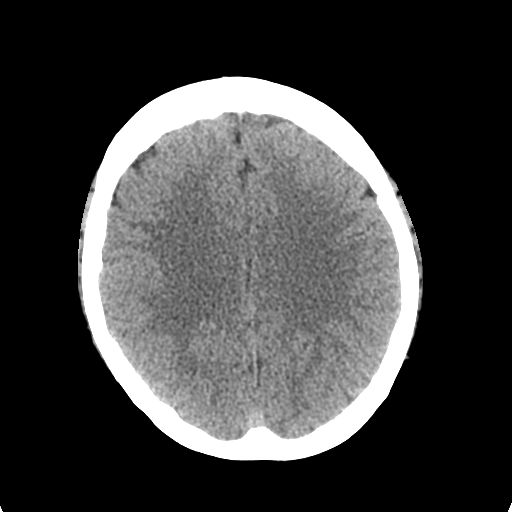
[im 20/30  brain]
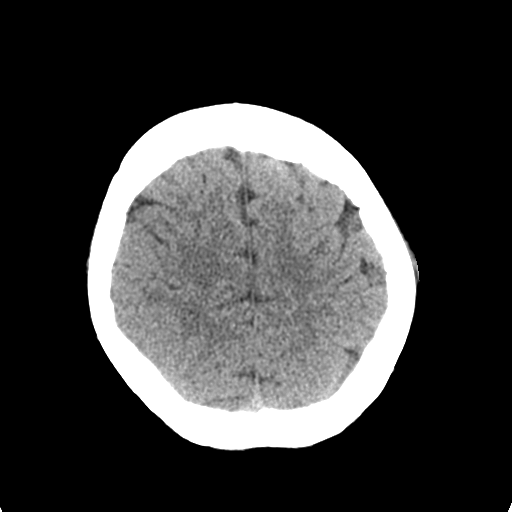
[im 23/30  brain]
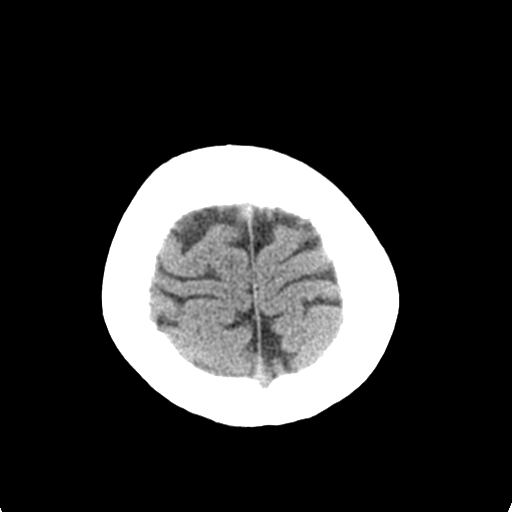
[im 25/30  brain]
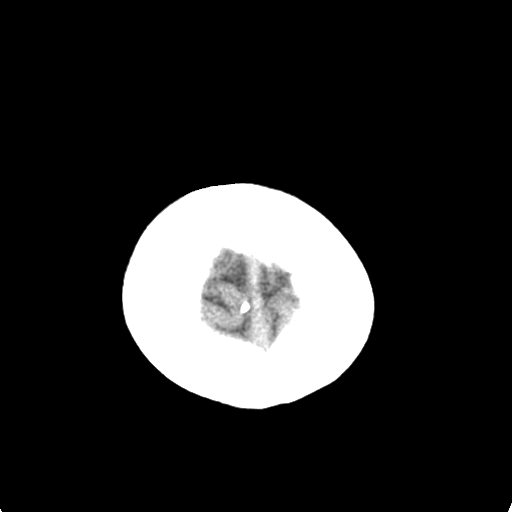
[im 25/30  bone]
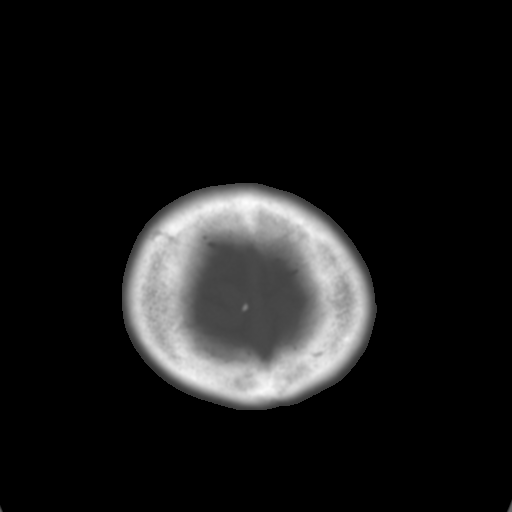
[im 28/30  brain]
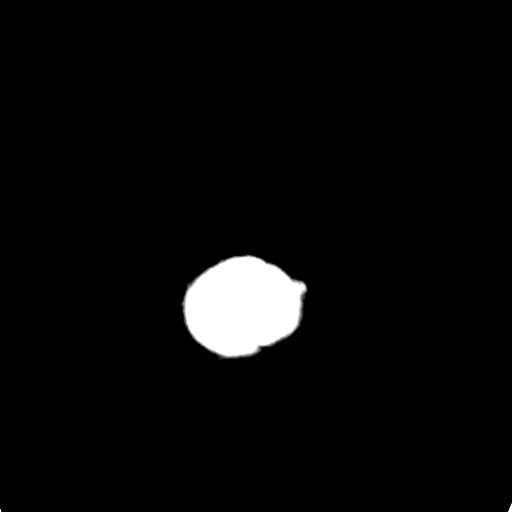

[Series 4: coronal soft tissue · coronal · 0.29mm/px · 3 of 61 slices shown]
[im 21/61  brain]
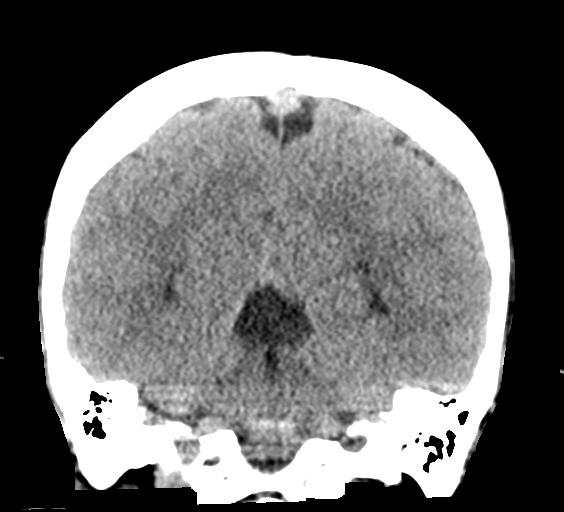
[im 27/61  brain]
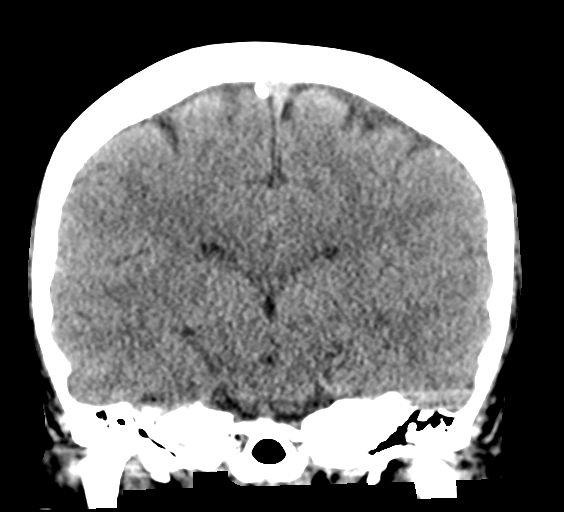
[im 34/61  brain]
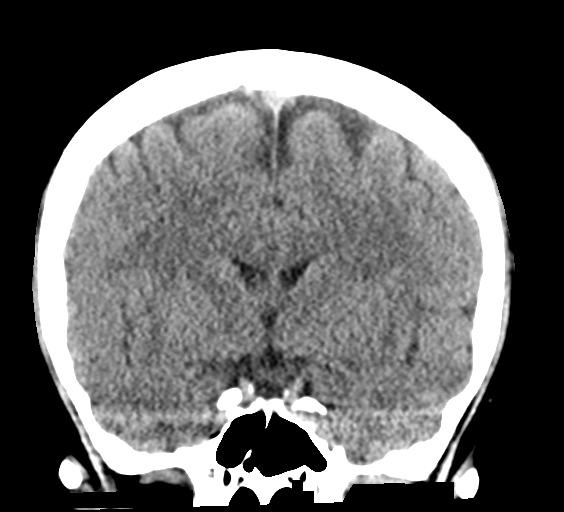

[Series 5: sagittal soft tissue · sagittal · 0.29mm/px · 3 of 55 slices shown]
[im 19/55  brain]
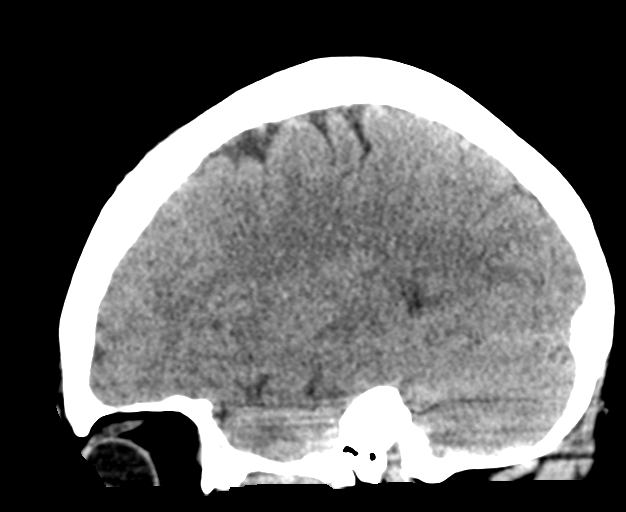
[im 28/55  brain]
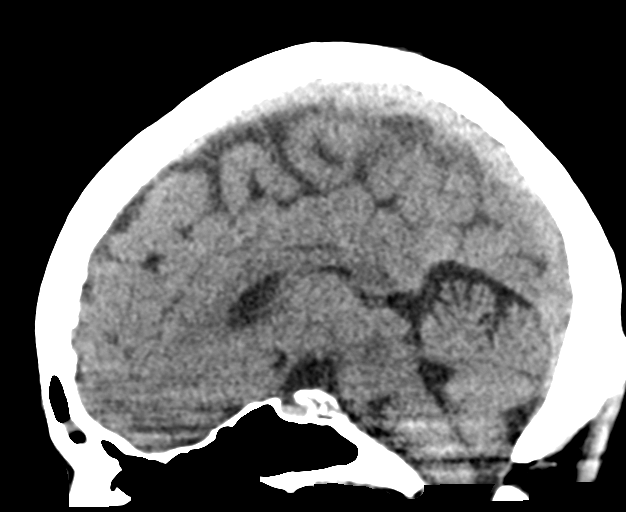
[im 37/55  brain]
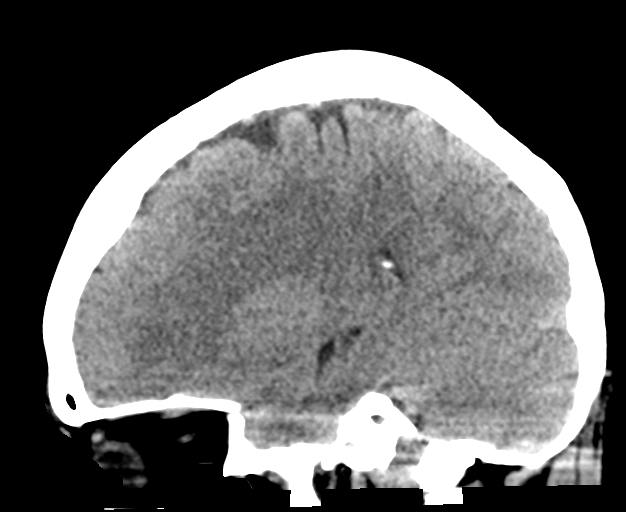

[16 of 47 positions shown; findings below may reference images not displayed]

FINDINGS: Brain: No evidence of acute infarction, hemorrhage, hydrocephalus,
extra-axial collection or mass lesion/mass effect. Linear infarct in
the right cerebellum that appears discrete and very low-density on
reformats, chronic appearing

Vascular: No hyperdense vessel or unexpected calcification.

Skull: Normal. Negative for fracture or focal lesion.

Sinuses/Orbits: No acute finding.
IMPRESSION: 1. No acute finding.
2. Small remote right cerebellar infarction.

## 2023-05-24 IMAGING — CR DG CHEST 2V
2 series · 2 of 2 positions shown · non-contrast
Comparison: 10/10/2020

CLINICAL DATA: Syncope and dizziness.

EXAM:
CHEST - 2 VIEW

[chest lat]
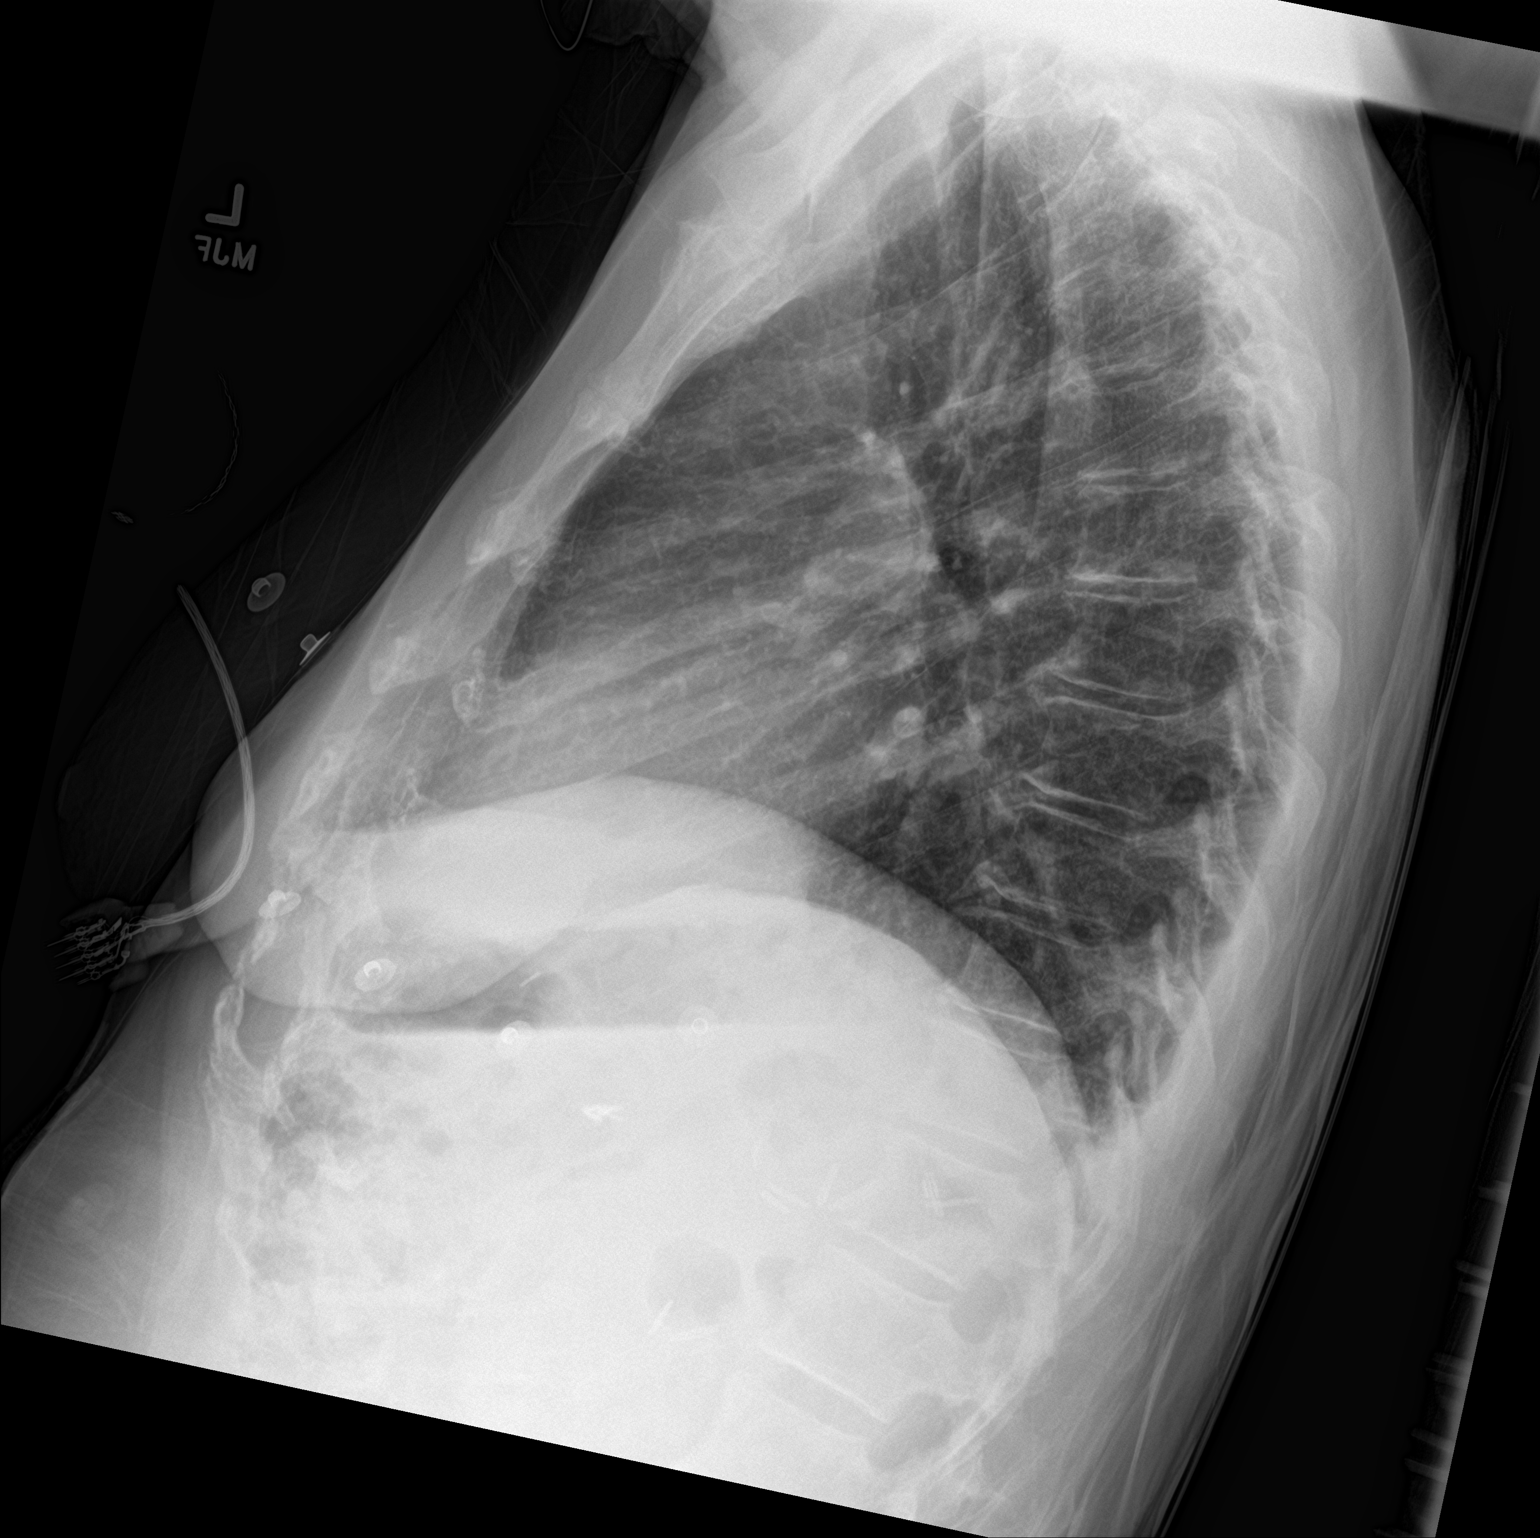

[chest ap]
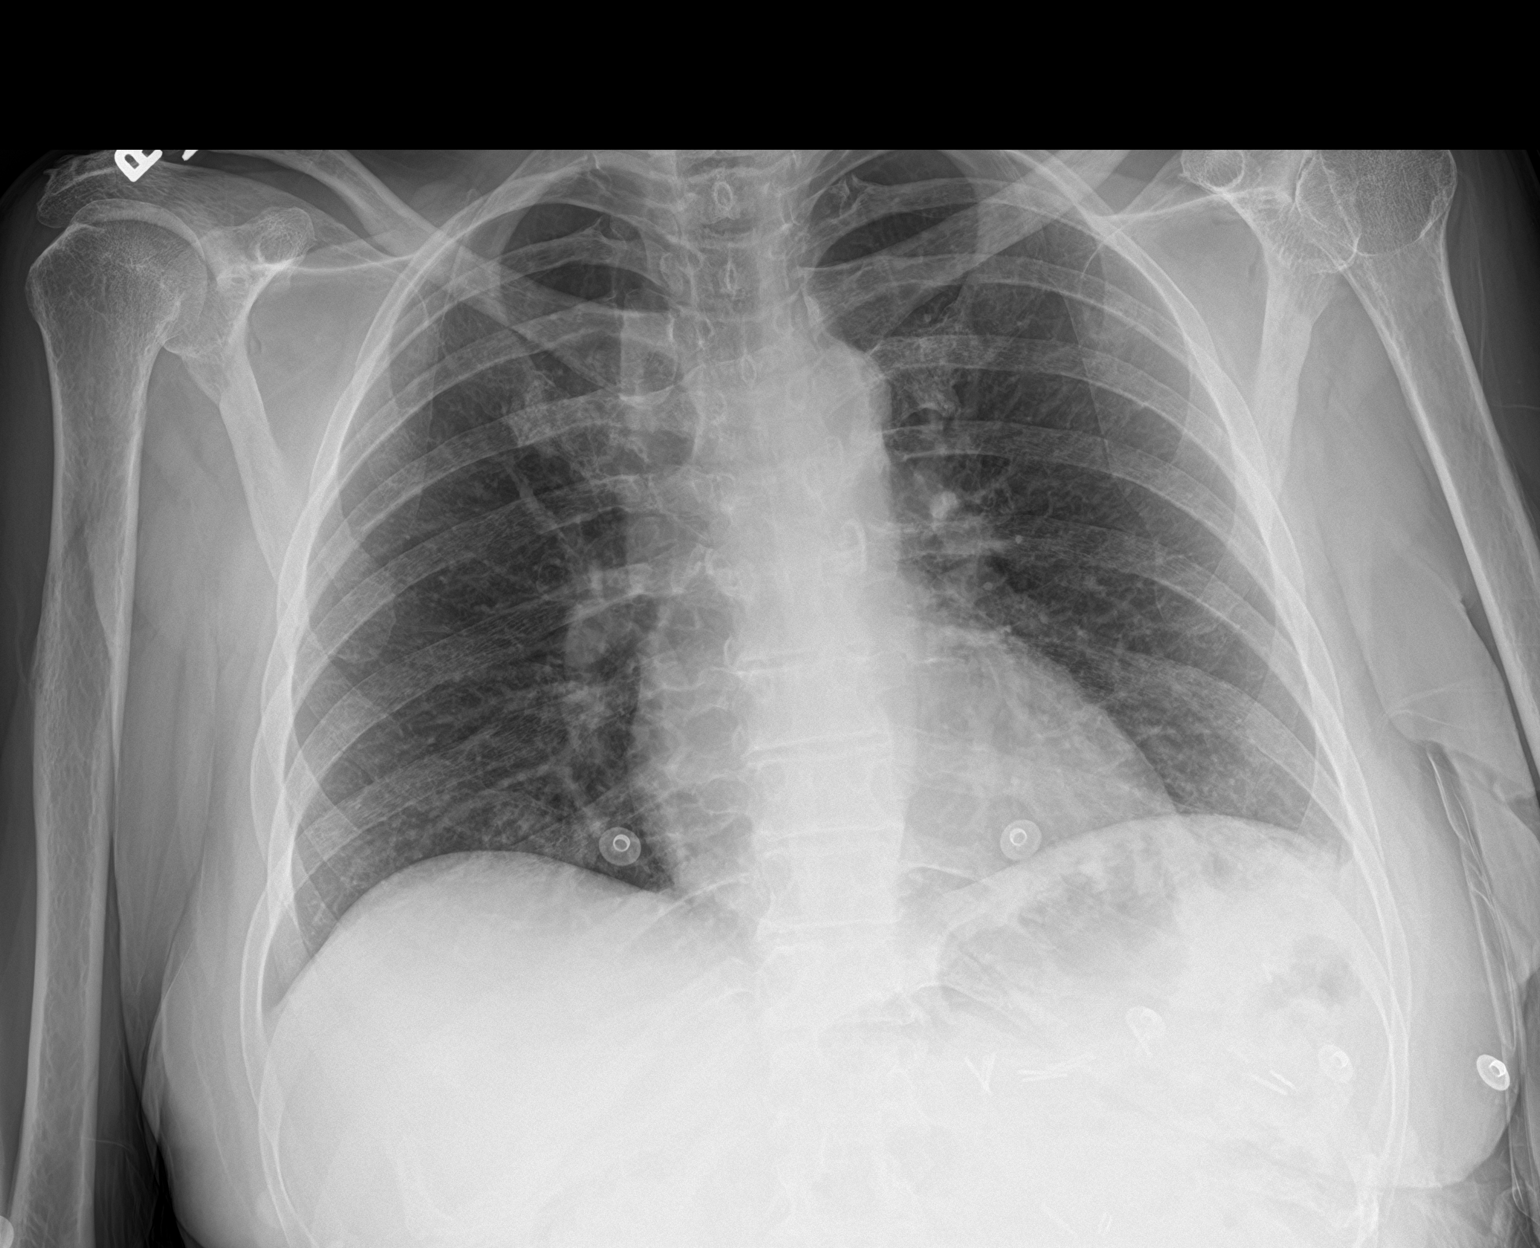

[2 of 2 positions shown; findings below may reference images not displayed]

FINDINGS: The heart size and mediastinal contours are within normal limits.
Minimal bibasilar atelectasis. There is no evidence of pulmonary
edema, consolidation, pneumothorax, nodule or pleural fluid. The
visualized skeletal structures are unremarkable.
IMPRESSION: No active cardiopulmonary disease.

## 2023-06-08 ENCOUNTER — Emergency Department
Admission: EM | Admit: 2023-06-08 | Discharge: 2023-06-08 | Disposition: A | Discharge status: Home / Self Care | Payer: Medicare Other | Attending: Emergency Medicine | Admitting: Emergency Medicine

## 2023-06-08 ENCOUNTER — Other Ambulatory Visit: Payer: Self-pay

## 2023-06-08 ENCOUNTER — Encounter: Payer: Self-pay | Admitting: Emergency Medicine

## 2023-06-08 DIAGNOSIS — R42 Dizziness and giddiness: Secondary | ICD-10-CM

## 2023-06-08 DIAGNOSIS — I480 Paroxysmal atrial fibrillation: Secondary | ICD-10-CM | POA: Diagnosis not present

## 2023-06-08 DIAGNOSIS — I4719 Other supraventricular tachycardia: Secondary | ICD-10-CM | POA: Insufficient documentation

## 2023-06-08 DIAGNOSIS — I471 Supraventricular tachycardia, unspecified: Secondary | ICD-10-CM

## 2023-06-08 DIAGNOSIS — R0602 Shortness of breath: Secondary | ICD-10-CM | POA: Diagnosis present

## 2023-06-08 LAB — URINALYSIS, ROUTINE W REFLEX MICROSCOPIC
Bilirubin Urine: NEGATIVE
Glucose, UA: NEGATIVE mg/dL
Hgb urine dipstick: NEGATIVE
Ketones, ur: NEGATIVE mg/dL
Leukocytes,Ua: NEGATIVE
Nitrite: NEGATIVE
Protein, ur: 30 mg/dL — AB
Specific Gravity, Urine: 1.014 (ref 1.005–1.030)
pH: 5 (ref 5.0–8.0)

## 2023-06-08 LAB — BASIC METABOLIC PANEL
Anion gap: 9 (ref 5–15)
BUN: 19 mg/dL (ref 6–20)
CO2: 25 mmol/L (ref 22–32)
Calcium: 9.1 mg/dL (ref 8.9–10.3)
Chloride: 105 mmol/L (ref 98–111)
Creatinine, Ser: 1.23 mg/dL — ABNORMAL HIGH (ref 0.44–1.00)
GFR, Estimated: 51 mL/min — ABNORMAL LOW (ref >60)
Glucose, Bld: 138 mg/dL — ABNORMAL HIGH (ref 70–99)
Potassium: 4.4 mmol/L (ref 3.5–5.1)
Sodium: 139 mmol/L (ref 135–145)

## 2023-06-08 LAB — CBC
HCT: 37.9 % (ref 36.0–46.0)
Hemoglobin: 12.2 g/dL (ref 12.0–15.0)
MCH: 27.5 pg (ref 26.0–34.0)
MCHC: 32.2 g/dL (ref 30.0–36.0)
MCV: 85.6 fL (ref 80.0–100.0)
Platelets: 424 10*3/uL — ABNORMAL HIGH (ref 150–400)
RBC: 4.43 MIL/uL (ref 3.87–5.11)
RDW: 14.9 % (ref 11.5–15.5)
WBC: 8.3 10*3/uL (ref 4.0–10.5)
nRBC: 0 % (ref 0.0–0.2)

## 2023-06-08 LAB — TROPONIN I (HIGH SENSITIVITY): Troponin I (High Sensitivity): 12 ng/L (ref <18)

## 2023-06-08 MED ORDER — METOPROLOL TARTRATE 25 MG PO TABS
25.0000 mg | ORAL_TABLET | Freq: Once | ORAL | Status: AC
  Administered 2023-06-08: 25 mg via ORAL
  Filled 2023-06-08: qty 1

## 2023-06-08 NOTE — ED Notes (Signed)
Mr Briana Young, the emergency contact for the pt was called for pt ride home.  Mr. Briana Young stated that he will come and get pt.  Pt was given a food tray and drink

## 2023-06-08 NOTE — ED Notes (Signed)
First nurse note:  [facility] from [facility] in [place] for near syncopal episodes. Orthostatic BP positive (25pt drop in SBP). Tachycardic 120-130, ST on EKG. BP was 130/84 sitting, 108/78 standing then 106/68 standing.   Pt has baby doll with her, hx cognitive impairment.

## 2023-06-08 NOTE — ED Provider Notes (Signed)
[facility] Provider Note   Event Date/Time   First MD Initiated Contact with Patient 06/08/23 1132     (approximate) History  Near Syncope  HPI Briana Young is a 57 y.o. female with a stated past medical history of paroxysmal atrial fibrillation and paroxysmal show SVT who presents complaining of palpitations and shortness of breath that occurred in the shower just prior to arrival.  Patient arrives via EMS who stated patient's heart rate was in the 110 to 120s during transportation.  Patient denies any loss of consciousness.  EMS did not give any medications or IV fluid en route. ROS: Patient currently denies any vision changes, tinnitus, difficulty speaking, facial droop, sore throat, chest pain, shortness of breath, abdominal pain, nausea/vomiting/diarrhea, dysuria, or weakness/numbness/paresthesias in any extremity   Physical Exam  Triage Vital Signs: ED Triage Vitals  Encounter Vitals Group     BP 06/08/23 1019 (!) 119/94     Systolic BP Percentile --      Diastolic BP Percentile --      Pulse Rate 06/08/23 1019 (!) 121     Resp 06/08/23 1019 18     Temp 06/08/23 1019 99.1 F (37.3 C)     Temp Source 06/08/23 1019 Oral     SpO2 06/08/23 1019 100 %     Weight --      Height 06/08/23 1016 5\' 8"  (1.727 m)     Head Circumference --      Peak Flow --      Pain Score 06/08/23 1016 6     Pain Loc --      Pain Education --      Exclude from Growth Chart --    Most recent vital signs: Vitals:   06/08/23 1140 06/08/23 1245  BP: 134/88 (!) 143/64  Pulse: (!) 118 (!) 115  Resp: 18 (!) 31  Temp:    SpO2: 99% 100%   General: Awake, oriented x4. CV:  Good peripheral perfusion.  Resp:  Normal effort.  Abd:  No distention.  Other:  Middle-aged overweight Caucasian female resting comfortably in no acute distress ED Results / Procedures / Treatments  Labs (all labs ordered are listed, but only abnormal results are displayed) Labs Reviewed  BASIC  METABOLIC PANEL - Abnormal; Notable for the following components:      Result Value   Glucose, Bld 138 (*)    Creatinine, Ser 1.23 (*)    GFR, Estimated 51 (*)    All other components within normal limits  CBC - Abnormal; Notable for the following components:   Platelets 424 (*)    All other components within normal limits  URINALYSIS, ROUTINE W REFLEX MICROSCOPIC - Abnormal; Notable for the following components:   Color, Urine YELLOW (*)    APPearance HAZY (*)    Protein, ur 30 (*)    Bacteria, UA RARE (*)    All other components within normal limits  CBG MONITORING, ED  TROPONIN I (HIGH SENSITIVITY)   EKG ED ECG REPORT I, Merwyn Katos, the attending physician, personally viewed and interpreted this ECG. Date: 06/08/2023 EKG Time: 1019 Rate: 119 Rhythm: Unspecified supraventricular rhythm QRS Axis: normal Intervals: normal ST/T Wave abnormalities: normal Narrative Interpretation: Supraventricular tachycardia.  No evidence of acute ischemia PROCEDURES: Critical Care performed: No .1-3 Lead EKG Interpretation  Performed by: Merwyn Katos, MD Authorized by: Merwyn Katos, MD     Interpretation: abnormal     ECG rate:  97  ECG rate assessment: normal     Rhythm: atrial fibrillation     Ectopy: none     Conduction: normal    MEDICATIONS ORDERED IN ED: Medications  metoprolol tartrate (LOPRESSOR) tablet 25 mg (25 mg Oral Given 06/08/23 1247)   IMPRESSION / MDM / ASSESSMENT AND PLAN / ED COURSE  I reviewed the triage vital signs and the nursing notes.                             The patient is on the cardiac monitor to evaluate for evidence of arrhythmia and/or significant heart rate changes. Patient's presentation is most consistent with acute presentation with potential threat to life or bodily function. + Supraventricular tachycardia DDx: Pneumothorax, Pneumonia, Pulmonary Embolus, Tamponade, ACS, Thyrotoxicosis.  No history or evidence decompensated heart  failure. Given their history and exam it is likely this patient is unlikely to spontaneously revert to a rate controlled rhythm and necessitates a thorough workup for their arrhythmia. Workup: ECG, CBC, BMP, UA, Troponin Interventions: Metoprolol tartrate 25 mg p.o. As patient did not show significant signs of tachycardia at this time with heart rate between 110 and 120 that responded appropriately to a 25 mg dose of oral metoprolol, patient is stable for discharge at this time with anticipation of follow-up with cardiology within the next 3-5 days for further evaluation of patient's persistent yet intermittent lightheadedness and palpitations Disposition: Admit   FINAL CLINICAL IMPRESSION(S) / ED DIAGNOSES   Final diagnoses:  Intermittent lightheadedness  Paroxysmal atrial fibrillation (HCC)  PSVT (paroxysmal supraventricular tachycardia) (HCC)   Rx / DC Orders   ED Discharge Orders          Ordered    Ambulatory referral to Cardiology       Comments: If you have not heard from the Cardiology office within the next 72 hours please call [phone].   06/08/23 1413           Note:  This document was prepared using Dragon voice recognition software and may include unintentional dictation errors.   Merwyn Katos, MD 06/08/23 [id number]

## 2023-06-08 NOTE — ED Triage Notes (Signed)
Pt via [facility] from [facility] in [place]. Pt had a near syncopal episode in the shower, reports sudden onset of dizziness and palpations but denies falling. Pt is c/o abd pain. Pt is A&OX4 and NAD.

## 2023-06-17 ENCOUNTER — Emergency Department: Payer: Medicare Other

## 2023-06-17 ENCOUNTER — Ambulatory Visit: Payer: Medicare Other | Attending: Internal Medicine | Admitting: Internal Medicine

## 2023-06-17 ENCOUNTER — Other Ambulatory Visit: Payer: Self-pay

## 2023-06-17 ENCOUNTER — Encounter: Payer: Self-pay | Admitting: Internal Medicine

## 2023-06-17 ENCOUNTER — Emergency Department
Admission: EM | Admit: 2023-06-17 | Discharge: 2023-06-17 | Disposition: A | Discharge status: Home / Self Care | Payer: Medicare Other | Attending: Emergency Medicine | Admitting: Emergency Medicine

## 2023-06-17 VITALS — BP 108/72 | HR 122 | Wt 160.4 lb

## 2023-06-17 DIAGNOSIS — Z7901 Long term (current) use of anticoagulants: Secondary | ICD-10-CM | POA: Diagnosis not present

## 2023-06-17 DIAGNOSIS — I10 Essential (primary) hypertension: Secondary | ICD-10-CM | POA: Insufficient documentation

## 2023-06-17 DIAGNOSIS — I471 Supraventricular tachycardia, unspecified: Secondary | ICD-10-CM | POA: Diagnosis not present

## 2023-06-17 DIAGNOSIS — I4891 Unspecified atrial fibrillation: Secondary | ICD-10-CM | POA: Diagnosis not present

## 2023-06-17 DIAGNOSIS — E119 Type 2 diabetes mellitus without complications: Secondary | ICD-10-CM | POA: Insufficient documentation

## 2023-06-17 DIAGNOSIS — E785 Hyperlipidemia, unspecified: Secondary | ICD-10-CM

## 2023-06-17 DIAGNOSIS — E1169 Type 2 diabetes mellitus with other specified complication: Secondary | ICD-10-CM

## 2023-06-17 DIAGNOSIS — I480 Paroxysmal atrial fibrillation: Secondary | ICD-10-CM

## 2023-06-17 DIAGNOSIS — R000 Tachycardia, unspecified: Secondary | ICD-10-CM | POA: Diagnosis present

## 2023-06-17 LAB — BASIC METABOLIC PANEL
Anion gap: 10 (ref 5–15)
BUN: 32 mg/dL — ABNORMAL HIGH (ref 6–20)
CO2: 24 mmol/L (ref 22–32)
Calcium: 8.9 mg/dL (ref 8.9–10.3)
Chloride: 102 mmol/L (ref 98–111)
Creatinine, Ser: 1.5 mg/dL — ABNORMAL HIGH (ref 0.44–1.00)
GFR, Estimated: 40 mL/min — ABNORMAL LOW (ref >60)
Glucose, Bld: 113 mg/dL — ABNORMAL HIGH (ref 70–99)
Potassium: 4.1 mmol/L (ref 3.5–5.1)
Sodium: 136 mmol/L (ref 135–145)

## 2023-06-17 LAB — CBC
HCT: 35.6 % — ABNORMAL LOW (ref 36.0–46.0)
Hemoglobin: 11.5 g/dL — ABNORMAL LOW (ref 12.0–15.0)
MCH: 28 pg (ref 26.0–34.0)
MCHC: 32.3 g/dL (ref 30.0–36.0)
MCV: 86.6 fL (ref 80.0–100.0)
Platelets: 430 10*3/uL — ABNORMAL HIGH (ref 150–400)
RBC: 4.11 MIL/uL (ref 3.87–5.11)
RDW: 15.2 % (ref 11.5–15.5)
WBC: 8.7 10*3/uL (ref 4.0–10.5)
nRBC: 0 % (ref 0.0–0.2)

## 2023-06-17 LAB — TROPONIN I (HIGH SENSITIVITY): Troponin I (High Sensitivity): 13 ng/L (ref <18)

## 2023-06-17 LAB — TSH: TSH: 0.231 u[IU]/mL — ABNORMAL LOW (ref 0.350–4.500)

## 2023-06-17 MED ORDER — SODIUM CHLORIDE 0.9 % IV BOLUS
1000.0000 mL | Freq: Once | INTRAVENOUS | Status: AC
Start: 2023-06-17 — End: 2023-06-17
  Administered 2023-06-17: 1000 mL via INTRAVENOUS

## 2023-06-17 MED ORDER — METOPROLOL TARTRATE 5 MG/5ML IV SOLN
2.5000 mg | Freq: Once | INTRAVENOUS | Status: AC
  Administered 2023-06-17: 2.5 mg via INTRAVENOUS
  Filled 2023-06-17: qty 5

## 2023-06-17 NOTE — Patient Instructions (Signed)
 Medication Instructions:  Your physician recommends that you continue on your current medications as directed. Please refer to the Current Medication list given to you today.   *If you need a refill on your cardiac medications before your next appointment, please call your pharmacy*   Lab Work: No labs ordered today    Testing/Procedures: No test ordered today    Follow-Up: At [facility], you and your health needs are our priority.  As part of our continuing mission to provide you with exceptional heart care, we have created designated Provider Care Teams.  These Care Teams include your primary Cardiologist (physician) and Advanced Practice Providers (APPs -  Physician Assistants and Nurse Practitioners) who all work together to provide you with the care you need, when you need it.  We recommend signing up for the patient portal called "MyChart".  Sign up information is provided on this After Visit Summary.  MyChart is used to connect with patients for Virtual Visits (Telemedicine).  Patients are able to view lab/test results, encounter notes, upcoming appointments, etc.  Non-urgent messages can be sent to your provider as well.   To learn more about what you can do with MyChart, go to [email or web address].    Your next appointment:   Based on hospitalization   Provider:   You may see Yvonne Kendall, MD or one of the following Advanced Practice Providers on your designated Care Team:   Nicolasa Ducking, NP Eula Listen, PA-C Cadence Fransico Michael, PA-C Charlsie Quest, NP Carlos Levering, NP

## 2023-06-17 NOTE — ED Notes (Signed)
 See triage notes. Patient c/o her "heart jumping." Caregiver is concerned about SVT

## 2023-06-17 NOTE — ED Provider Notes (Signed)
 [facility] Provider Note    Event Date/Time   First MD Initiated Contact with Patient 06/17/23 1245     (approximate)  History   Chief Complaint: Tachycardia  HPI  Briana Young is a 58 y.o. female with a past medical history of diabetes, hypertension, hyperlipidemia, mental retardation, paroxysmal atrial fibrillation, paroxysmal SVT, prior PE on Coumadin who presents to the emergency department for tachycardia.  According to the patient she was seen by her PCP today for a follow-up appointment and was referred to the emergency department for tachycardia in the 120s.  Patient states she can feel her heart racing but denies any chest pain denies any shortness of breath.  Patient has a history of prior SVT, prior atrial fibrillation as well as a prior PE.  Patient is reportedly on Coumadin.  Also reportedly takes metoprolol  daily.  Patient's caregiver is here with the patient who confirms that she has not missed any medications recently.  Physical Exam   Triage Vital Signs: ED Triage Vitals  Encounter Vitals Group     BP 06/17/23 1122 95/82     Systolic BP Percentile --      Diastolic BP Percentile --      Pulse Rate 06/17/23 1122 (!) 127     Resp 06/17/23 1122 17     Temp 06/17/23 1122 97.8 F (36.6 C)     Temp Source 06/17/23 1122 Oral     SpO2 06/17/23 1122 100 %     Weight 06/17/23 1123 160 lb 6.4 oz (72.8 kg)     Height 06/17/23 1123 5' 8 (1.727 m)     Head Circumference --      Peak Flow --      Pain Score 06/17/23 1123 0     Pain Loc --      Pain Education --      Exclude from Growth Chart --     Most recent vital signs: Vitals:   06/17/23 1122  BP: 95/82  Pulse: (!) 127  Resp: 17  Temp: 97.8 F (36.6 C)  SpO2: 100%    General: Awake, no distress.  CV:  Good peripheral perfusion.  Regular rhythm rate around 120 bpm. Resp:  Normal effort.  Equal breath sounds bilaterally.  Abd:  No distention.  Soft, nontender.  No rebound or  guarding.  ED Results / Procedures / Treatments   EKG  EKG viewed and interpreted by myself shows what appears to be atrial flutter versus fibrillation at 127 bpm somewhat irregular suspect more likely atrial fibrillation.  Narrow QRS, normal axis normal intervals with nonspecific but no concerning ST changes.  RADIOLOGY  Chest x-ray viewed and interpreted by myself shows no consolidation. Radiology has read the x-ray as negative.   MEDICATIONS ORDERED IN ED: Medications  metoprolol  tartrate (LOPRESSOR ) injection 2.5 mg (2.5 mg Intravenous Given 06/17/23 1324)  sodium chloride  0.9 % bolus 1,000 mL (1,000 mLs Intravenous New Bag/Given 06/17/23 1323)     IMPRESSION / MDM / ASSESSMENT AND PLAN / ED COURSE  I reviewed the triage vital signs and the nursing notes.  Patient's presentation is most consistent with acute presentation with potential threat to life or bodily function.  Patient presents to the emergency department for tachycardia sent by her PCP for heart rate in the 120s.  Patient states she can occasionally feel her heart is racing but otherwise has no symptoms.  Denies any chest pain or shortness of breath.  In reviewing the patient's  EKG she appears to be in atrial fibrillation which would explain her elevated heart rate.  Patient has a history of paroxysmal atrial fibrillation per report takes metoprolol  and Coumadin.  We will check an INR.  Will dose 2.5 mg of IV metoprolol  as well as a liter of IV fluids.  Will continue to closely monitor.  Lab work is reassuring with a normal CBC, slight renal insufficiency otherwise reassuring chemistry and a negative troponin.  I have added on a TSH as well.  Patient's workup is reassuring TSH 0.23.  Troponin negative CBC and chemistry overall reassuring as well.  Patient has received a liter of fluids and 2.5 mg of metoprolol .  Patient has maintained heart rate in the 70s/80s for greater than 1 hour at this time.  Highly suspect atrial  fibrillation as the patient is anticoagulated on rate control medications and now rate controlled I believe the patient is safe for discharge home with outpatient follow-up.  Patient and caregiver agreeable to plan of care.  FINAL CLINICAL IMPRESSION(S) / ED DIAGNOSES   Atrial fibrillation with rapid ventricular response Tachycardia   Note:  This document was prepared using Dragon voice recognition software and may include unintentional dictation errors.   Dorothyann Drivers, MD 06/17/23 1510

## 2023-06-17 NOTE — ED Triage Notes (Signed)
 First nurse note: pt to ED from cardiology for HR 120s. Denies chest pain

## 2023-06-17 NOTE — Progress Notes (Signed)
 Cardiology Office Note:  .   Date:  06/17/2023  ID:  Briana Young, DOB [birth year], MRN [id number] PCP: Lorel Maxie LABOR, MD  [facility] Providers Cardiologist:  Lonni Hanson, MD     History of Present Illness: .   Briana Young is a 58 y.o. female with history of supraventricular tachycardia, paroxysmal atrial fibrillation that occurred in the setting of PE following pancreatic mass excision in 2012, hyperlipidemia, and type 2 diabetes mellitus, who presents for follow-up of SVT.  I last saw her in late October, at which time she was feeling fairly well but still complained of sporadic palpitations and occasional orthostatic lightheadedness.  She had been hospitalized a month earlier due to hyponatremia in the setting of headache, dizziness, and constipation.  Her hyponatremia was attributed to poor oral intake and Paxil  use.  We deferred medication changes and additional testing.  She presented to the [facility] emergency department in late December with palpitations and shortness of breath.  Presenting EKG was consistent with SVT, which resolved after receiving metoprolol  tartrate 25 mg p.o. x 1.  Today, Briana Young reports that she developed palpitations this morning shortly after waking up.  Palpitations are ongoing and are associated with lightheadedness.  She denies frank chest pan but feels like the palpitations make her stomach hurt from time to time.  This is the first episode of palpitations since her recent ED visit.  She denies dyspnea.  She has been compliant with her medications including metoprolol .  She has not passed out.  ROS: See HPI  Studies Reviewed: SABRA   EKG Interpretation Date/Time:  Monday June 17 2023 10:49:07 EST Ventricular Rate:  122 PR Interval:    QRS Duration:  86 QT Interval:  326 QTC Calculation: 464 R Axis:   40  Text Interpretation: Supraventricular tachycardia Nonspecific ST abnormality Abnormal ECG When compared with ECG of 08-Jun-2023 10:19, No  significant change was found Confirmed by Shunta Mclaurin ([id number]) on 06/17/2023 11:09:51 AM    TTE (05/09/2021):  1. Left ventricular ejection fraction, by estimation, is 55 to 60%. The  left ventricle has normal function. The left ventricle has no regional  wall motion abnormalities. Left ventricular diastolic parameters are  consistent with Grade I diastolic  dysfunction (impaired relaxation).   2. Right ventricular systolic function is normal. The right ventricular  size is normal. There is normal pulmonary artery systolic pressure. The  estimated right ventricular systolic pressure is 19.9 mmHg.   3. The mitral valve is normal in structure. Mild mitral valve  regurgitation. No evidence of mitral stenosis.   4. The aortic valve is normal in structure. Aortic valve regurgitation is  mild. No aortic stenosis is present.   5. The inferior vena cava is normal in size with greater than 50%  respiratory variability, suggesting right atrial pressure of 3 mmHg.  Risk Assessment/Calculations:    CHA2DS2-VASc Score =     This indicates a  % annual risk of stroke. The patient's score is based upon:              Physical Exam:   VS:  BP 108/72 (BP Location: Left Arm, Patient Position: Sitting, Cuff Size: Normal)   Pulse (!) 122   Wt 160 lb 6.4 oz (72.8 kg)   SpO2 97%   BMI 24.39 kg/m    Wt Readings from Last 3 Encounters:  06/17/23 160 lb 6.4 oz (72.8 kg)  04/10/23 157 lb 12.8 oz (71.6 kg)  03/11/23 135 lb (61.2  kg)    General:  NAD.  Accompanied by caregiver. Neck: No JVD or HJR. Lungs: Clear to auscultation bilaterally without wheezes or crackles. Heart: Tachycardic but regular without murmurs, rubs, or gallops. Abdomen: Soft, nontender, nondistended. Extremities: No lower extremity edema.  ASSESSMENT AND PLAN: .    Supraventricuar tachycardia: Briana Young reports onset of palpitations shortly after waking up this morning and continues to have palpitations as well as some  lightheadedness.  She is tachycardic with EKG demonstrating a narrow complex tachycardia with retrograde P waves consistent with a short RP tachycardia.  She has been compliant with her home dose of metoprolol .  We attempted multiple vagal maneuvers in the clinic without resolution of tachycardia.  We discussed transferring her to the ED for further workup and management versus administration of IV adenosine in the office.  Briana Young does not wish to receive adenosine in the clinic and would prefer further workup in the ER.  Adenosine should be reconsidered if she remains in SVT.  After acute treatment of her SVT, I recommend escalation of her standing metoprolol  (if blood pressure allows) and outpatient consultation with electrophysiology to discuss long-term treatment options.  Atrial fibrillation: Single episode of atrial fibrillation noted in the remote past in the setting of PE.  Briana Young is not on long-term anticoagulation.  Defer initiation of AC at this time.  Hyperlipidemia associated with type 2 diabetes mellitus: Continue atorvastatin  and metformin with ongoing management per PCP.    Dispo: To be determined based on ED course.  Recommend EP consultation as soon as possible to assist with long-term management of recurrent SVT.  Signed, Lonni Hanson, MD

## 2023-06-17 NOTE — Discharge Instructions (Addendum)
 Please take all your medications as prescribed by your doctor including this evening's medications.

## 2023-06-17 NOTE — ED Triage Notes (Signed)
 Pt sts that she was at the cardiologist office and they advised her to come to the ED. Pt denies any CP at this time.

## 2023-06-21 ENCOUNTER — Telehealth: Payer: Self-pay | Admitting: *Deleted

## 2023-06-21 DIAGNOSIS — I471 Supraventricular tachycardia, unspecified: Secondary | ICD-10-CM

## 2023-06-21 NOTE — Telephone Encounter (Signed)
-----   Message from [place] sent at 06/20/2023  1:10 PM EST ----- Regarding: Follow-up Good afternoon,  I saw Briana Young on Monday and referred her to the ED due to tachycardia suspicious for SVT.  She was treated in the ED and sent home.  Could you arrange for consultation with one of the EP doctors at their earliest convenience?  Thanks.  [place]

## 2023-06-21 NOTE — Telephone Encounter (Signed)
 Amb Ref order placed for Dr. Lalla Brothers Dr. Jimmey Ralph for consultation for SVT.  Please reach out to the patient to schedule.   Thanks!

## 2023-06-24 NOTE — Progress Notes (Signed)
 Electrophysiology Office Note:   Date:  06/25/2023  ID:  Briana Young, DOB 07-14-1965, MRN [id number]  Primary Cardiologist: Lonni Hanson, MD Primary Heart Failure: None Electrophysiologist: Fonda Kitty, MD      History of Present Illness:   Briana Young is a 57 y.o. female with h/o supraventricular tachycardia, paroxysmal atrial fibrillation that occurred in the setting of PE following pancreatic mass excision in 2012, hyperlipidemia, and type 2 diabetes mellitus who is being seen today for evaluation of her arrhythmias at the request of Dr. Hanson.  Patient recently presented to her outpatient cardiology appointment on 06/17/2023 he was complaining of palpitations that began earlier that day.  She was found to be in SVT and was sent to the ED. She received IV metoprolol  and converted. She reports experiencing palpitations approximately every couple of weeks, sometimes associated with dizziness and lightheadedness. These can last for hours. She has been taking metoprolol  XL 25mg  once daily at night.  Otherwise no new or acute complaints today.  Review of systems complete and found to be negative unless listed in HPI.   EP Information / Studies Reviewed:    EKG is not ordered today. EKG from 06/17/2023 reviewed which showed SVT described as a short RP tachycardia with retrograde P waves in the inferior leads.      Echo 05/09/2021:  Normal LV size and function.  LVEF 55 to 60%.  1 diastolic dysfunction. Normal RV size and function. Mild MR.  Mild AI.       Physical Exam:   VS:  BP 114/80   Pulse 70   Ht 5' 8 (1.727 m)   Wt 160 lb 3.2 oz (72.7 kg)   SpO2 99%   BMI 24.36 kg/m    Wt Readings from Last 3 Encounters:  06/25/23 160 lb 3.2 oz (72.7 kg)  06/17/23 160 lb 6.4 oz (72.8 kg)  06/17/23 160 lb 6.4 oz (72.8 kg)     GEN: Well nourished, well developed in no acute distress NECK: No JVD;  CARDIAC: Normal rate, regular.  RESPIRATORY:  Clear to auscultation without rales,  wheezing or rhonchi  ABDOMEN: Soft, non-tender, non-distended EXTREMITIES:  No edema; No deformity   ASSESSMENT AND PLAN:   Briana Young is a 58 y.o. female with h/o supraventricular tachycardia, paroxysmal atrial fibrillation that occurred in the setting of PE following pancreatic mass excision in 2012, hyperlipidemia, and type 2 diabetes mellitus who is being seen today for evaluation of her arrhythmias at the request of Dr. Hanson.  # SVT: There are numerous EKGs in the chart showing a short RP tachycardia with retrograde P waves in the inferior leads.  My suspicion would be for slow AVNRT or junctional tachycardia, although cannot rule out atrial tachycardia coming from the low right atrium. # Palpitations: - We discussed EP study and catheter ablation. Patient would like to speak with her family about these options and prefers to continue on medication medical therapy at this time.  We will increase her metoprolol  XL to 25 mg twice daily.  We will give her a ZIO monitor in approximately 2 weeks to assess her overall burden of SVT.  She will return to clinic in 3 months to review ZIO monitor and rediscuss options.  At that visit we can continue to uptitrate beta-blocker as BP allows if patient still does not want to pursue catheter ablation.  # Lone atrial fibrillation: -We will order 2-week ZIO monitor as above.  If atrial fibrillation is picked up  then we will discuss anticoagulation with her at that time.  My suspicion is that her 1 episode of atrial fibrillation that we know of could have been precipitated by SVT.  Follow up with EP APP in 3 months  Signed, Fonda Kitty, MD

## 2023-06-25 ENCOUNTER — Ambulatory Visit: Payer: Medicare Other | Attending: Cardiology | Admitting: Cardiology

## 2023-06-25 ENCOUNTER — Encounter: Payer: Self-pay | Admitting: Cardiology

## 2023-06-25 VITALS — BP 114/80 | HR 70 | Ht 68.0 in | Wt 160.2 lb

## 2023-06-25 DIAGNOSIS — I4891 Unspecified atrial fibrillation: Secondary | ICD-10-CM | POA: Diagnosis present

## 2023-06-25 DIAGNOSIS — R002 Palpitations: Secondary | ICD-10-CM | POA: Insufficient documentation

## 2023-06-25 DIAGNOSIS — I471 Supraventricular tachycardia, unspecified: Secondary | ICD-10-CM | POA: Diagnosis not present

## 2023-06-25 MED ORDER — METOPROLOL SUCCINATE ER 25 MG PO TB24: 25.0000 mg | ORAL_TABLET | Freq: Two times a day (BID) | ORAL | 3 refills | Status: DC

## 2023-06-25 NOTE — Patient Instructions (Signed)
 Medication Instructions:  Your physician has recommended you make the following change in your medication:  1) INCREASE Toprol  XL (metoprolol  succinate) to 20 mg twice daily   *If you need a refill on your cardiac medications before your next appointment, please call your pharmacy*  Testing/Procedures: IN TWO WEEKS: Your physician has recommended that you wear an event monitor. Event monitors are medical devices that record the heart's electrical activity. Doctors most often us  these monitors to diagnose arrhythmias. Arrhythmias are problems with the speed or rhythm of the heartbeat. The monitor is a small, portable device. You can wear one while you do your normal daily activities. This is usually used to diagnose what is causing palpitations/syncope (passing out).  Follow-Up: At [facility], you and your health needs are our priority.  As part of our continuing mission to provide you with exceptional heart care, we have created designated Provider Care Teams.  These Care Teams include your primary Cardiologist (physician) and Advanced Practice Providers (APPs -  Physician Assistants and Nurse Practitioners) who all work together to provide you with the care you need, when you need it.  Your next appointment:   3 months  Provider:   Suzann Riddle, NP    Other Instructions ZIO XT- Long Term Monitor Instructions  Your physician has requested you wear a ZIO patch monitor for 14 days.  This is a single patch monitor. [employer] supplies one patch monitor per enrollment. Additional stickers are not available. Please do not apply patch if you will be having a Nuclear Stress Test,  Echocardiogram, Cardiac CT, MRI, or Chest Xray during the period you would be wearing the  monitor. The patch cannot be worn during these tests. You cannot remove and re-apply the  ZIO XT patch monitor.  Your ZIO patch monitor will be mailed 3 day USPS to your address on file. It may take 3-5 days  to receive  your monitor after you have been enrolled.  Once you have received your monitor, please review the enclosed instructions. Your monitor  has already been registered assigning a specific monitor serial # to you.  Billing and Patient Assistance Program Information  We have supplied [employer] with any of your insurance information on file for billing purposes. [employer] offers a sliding scale Patient Assistance Program for patients that do not have  insurance, or whose insurance does not completely cover the cost of the ZIO monitor.  You must apply for the Patient Assistance Program to qualify for this discounted rate.  To apply, please call [employer] at [phone], select option 4, select option 2, ask to apply for  Patient Assistance Program. Meredeth will ask your household income, and how many people  are in your household. They will quote your out-of-pocket cost based on that information.  [employer] will also be able to set up a 67-month, interest-free payment plan if needed.  Applying the monitor   Shave hair from upper left chest.  Hold abrader disc by orange tab. Rub abrader in 40 strokes over the upper left chest as  indicated in your monitor instructions.  Clean area with 4 enclosed alcohol pads. Let dry.  Apply patch as indicated in monitor instructions. Patch will be placed under collarbone on left  side of chest with arrow pointing upward.  Rub patch adhesive wings for 2 minutes. Remove white label marked 1. Remove the white  label marked 2. Rub patch adhesive wings for 2 additional minutes.  While looking in a mirror, press and release  button in center of patch. A small green light will  flash 3-4 times. This will be your only indicator that the monitor has been turned on.  Do not shower for the first 24 hours. You may shower after the first 24 hours.  Press the button if you feel a symptom. You will hear a small click. Record Date, Time and  Symptom in the Patient Logbook.  When  you are ready to remove the patch, follow instructions on the last 2 pages of Patient  Logbook. Stick patch monitor onto the last page of Patient Logbook.  Place Patient Logbook in the blue and white box. Use locking tab on box and tape box closed  securely. The blue and white box has prepaid postage on it. Please place it in the mailbox as  soon as possible. Your physician should have your test results approximately 7 days after the  monitor has been mailed back to [facility]  Call [facility] Customer Care at [phone] if you have questions regarding  your ZIO XT patch monitor. Call them immediately if you see an orange light blinking on your  monitor.  If your monitor falls off in less than 4 days, contact our Monitor department at [phone].  If your monitor becomes loose or falls off after 4 days call [employer] at [phone] for  suggestions on securing your monitor

## 2023-07-02 ENCOUNTER — Other Ambulatory Visit: Payer: Self-pay

## 2023-07-02 DIAGNOSIS — I4891 Unspecified atrial fibrillation: Secondary | ICD-10-CM

## 2023-07-02 DIAGNOSIS — R002 Palpitations: Secondary | ICD-10-CM

## 2023-07-02 DIAGNOSIS — I471 Supraventricular tachycardia, unspecified: Secondary | ICD-10-CM

## 2023-09-10 ENCOUNTER — Ambulatory Visit (INDEPENDENT_AMBULATORY_CARE_PROVIDER_SITE_OTHER): Admitting: Podiatry

## 2023-09-10 DIAGNOSIS — Q828 Other specified congenital malformations of skin: Secondary | ICD-10-CM | POA: Diagnosis not present

## 2023-09-10 NOTE — Progress Notes (Signed)
 Subjective:  Patient ID: Briana Young, female    DOB: Aug 20, 1965,  MRN: [id number]  Chief Complaint  Patient presents with   Porokeratosis    Porokeratosis    58 y.o. female presents with the above complaint.  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  Patient states painful to touch painful to walk on has progressive gotten worse.  She would like to get this evaluated.  She has not seen anyone else prior to seeing me.  Hurts with ambulation and hurts with taking step.  She wears [employer].  Pain scale 7 out of 10.   Review of Systems: Negative except as noted in the HPI. Denies N/V/F/Ch.  Past Medical History:  Diagnosis Date   Allergic rhinitis    Diabetes mellitus without complication (HCC)    Pt takes Metformin   Diastolic dysfunction    a. 04/2021 Echo: EF 55-60%, no rwma, GrI DD, Nl RV fxn, RVSP 19.77mmHg. Mild MR/AI.   Gingival hyperplasia    Hyperlipidemia    Hypertension    Mental retardation    Obesity    PAF (paroxysmal atrial fibrillation) (HCC)    a. 02/2011 Developed AF in the setting of PE following pancreatic mass excision.  No known recurrence -->CHA2DS2VASc = 3.   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   PSVT (paroxysmal supraventricular tachycardia) (HCC)    a. 03/2021 Seen in ED for PSVT.   Pulmonary embolism (HCC) 2012   on coumadin    Current Outpatient Medications:    acetaminophen (TYLENOL) 500 MG tablet, Take 500 mg by mouth every 6 (six) hours as needed., Disp: , Rfl:    aspirin EC 81 MG tablet, Take 81 mg by mouth daily. Swallow whole., Disp: , Rfl:    atorvastatin (LIPITOR) 20 MG tablet, Take 20 mg by mouth daily., Disp: , Rfl:    carbamide peroxide (DEBROX) 6.5 % OTIC solution, Place 5-10 drops into both ears 2 (two) times daily as needed (Ear wax removal)., Disp: , Rfl:    clotrimazole (LOTRIMIN) 1 % cream, Apply 1 application topically 2 (two) times daily., Disp: , Rfl:     fexofenadine (ALLEGRA) 180 MG tablet, Take 180 mg by mouth daily., Disp: , Rfl:    fluticasone (FLONASE) 50 MCG/ACT nasal spray, Place 2 sprays into both nostrils daily., Disp: , Rfl:    hydrocortisone cream 1 %, Apply 1 application topically 2 (two) times daily as needed for itching., Disp: , Rfl:    metFORMIN (GLUCOPHAGE) 500 MG tablet, Take 500 mg by mouth 2 (two) times daily with a meal., Disp: , Rfl:    metoprolol succinate (TOPROL XL) 25 MG 24 hr tablet, Take 1 tablet (25 mg total) by mouth 2 (two) times daily., Disp: 180 tablet, Rfl: 3   Multiple Vitamin (MULTIVITAMIN) capsule, Take 1 capsule by mouth daily., Disp: , Rfl:    olopatadine (PATANOL) 0.1 % ophthalmic solution, Place 1 drop into both eyes daily as needed for allergies., Disp: , Rfl:    omeprazole (PRILOSEC) 20 MG capsule, Take 40 mg by mouth daily., Disp: , Rfl:    PARoxetine (PAXIL) 40 MG tablet, Take 40 mg by mouth daily., Disp: , Rfl:    polyethylene glycol (MIRALAX / GLYCOLAX) 17 g packet, Take 17 g by mouth daily as needed., Disp: , Rfl:    pseudoephedrine (SUDAFED) 60 MG tablet, Take 60 mg by mouth every 6 (six) hours as needed., Disp: , Rfl:   Social  History   Tobacco Use  Smoking Status Never  Smokeless Tobacco Never    No Known Allergies Objective:  There were no vitals filed for this visit. There is no height or weight on file to calculate BMI. Constitutional Well developed. Well nourished.  Vascular Dorsalis pedis pulses palpable bilaterally. Posterior tibial pulses palpable bilaterally. Capillary refill normal to all digits.  No cyanosis or clubbing noted. Pedal hair growth normal.  Neurologic Normal speech. Oriented to person, place, and time. Epicritic sensation to light touch grossly present bilaterally.  Dermatologic Hyperkeratotic lesion with central nucleated core noted to left submetatarsal 5.  Underlying plantarflexed fifth metatarsal noted.  Pain on palpation to the lesion.  Orthopedic: Normal  joint ROM without pain or crepitus bilaterally. No visible deformities. No bony tenderness.   Radiographs: None Assessment:   1. Porokeratosis     Plan:  Patient was evaluated and treated and all questions answered.  Left porokeratosis with underlying plantarflexed metatarsal -All questions and concerns were discussed with the patient in extensive detail.  Given the amount of pain that she is having I believe she will benefit from debridement of the lesion followed by excision of central nucleated core.  Using chisel blade handle the lesion was debrided down to healthy striated tissue.  No complication noted no pinpoint bleeding noted. -If there is no improvement we will discuss floating osteotomy during next clinical visit. -I discussed shoe gear modification and orthotics as well.  She states understanding. -She is diabetic with unknown A1c  No follow-ups on file.

## 2023-09-23 NOTE — Progress Notes (Deleted)
     Electrophysiology Clinic Note    Date:  09/23/2023  Patient ID:  Briana Young, DOB November 29, 1965, MRN [id number] PCP:  Lorina Roosevelt, MD  Cardiologist:  Sammy Crisp, MD Electrophysiologist: Ardeen Kohler, MD  ***refresh  Discussed the use of AI scribe software for clinical note transcription with the patient, who gave verbal consent to proceed.   Patient Profile    Chief Complaint: ***  History of Present Illness: Briana Young is a 58 y.o. female with PMH notable for SVT, parox AFib iso PE, s/p pacreatic mass excision, T2DM; seen today for Ardeen Kohler, MD for routine electrophysiology followup.   She last saw Dr. Daneil Dunker 06/2023 to discuss her SVT. 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 She preferred medical mgmt, so toprol was increased with a two week zio to further eval. She is not on OAC d/t a lone AF event.  On follow-up today   Since last being seen in our clinic the patient reports doing ***.  she denies chest pain, palpitations, dyspnea, PND, orthopnea, nausea, vomiting, dizziness, syncope, edema, weight gain, or early satiety.    SVT, parox   Arrhythmia/Device History No specialty comments available.    Device Information: ***  AAD History: ***    ROS:  Please see the history of present illness. All other systems are reviewed and otherwise negative.    Physical Exam    VS:  There were no vitals taken for this visit. BMI: There is no height or weight on file to calculate BMI.  Wt Readings from Last 3 Encounters:  06/25/23 160 lb 3.2 oz (72.7 kg)  06/17/23 160 lb 6.4 oz (72.8 kg)  06/17/23 160 lb 6.4 oz (72.8 kg)     GEN- The patient is well appearing, alert and oriented x 3 today.   Lungs- Clear to ausculation bilaterally, normal work of breathing.  Heart- {Blank single:19197::"Regular","Irregularly irregular"} rate and rhythm, no murmurs, rubs or gallops Extremities- {EDEMA  LEVEL:28147::"No"} peripheral edema, warm, dry Skin-  *** device pocket well-healed, no tethering   Device interrogation done today and reviewed by myself:  Battery *** Lead thresholds, impedence, sensing stable *** *** episodes *** changes made today   Studies Reviewed   Previous EP, cardiology notes.    EKG {ACTION; IS/IS WUJ:81191478} ordered. Personal review of EKG from {Blank single:19197::"today","***"} shows:  ***             Assessment and Plan     #) ***   #) ***   {Are you ordering a CV Procedure (e.g. stress test, cath, DCCV, TEE, etc)?   Press F2        :[id number]}   Current medicines are reviewed at length with the patient today.   The patient {ACTIONS; HAS/DOES NOT HAVE:19233} concerns regarding her medicines.  The following changes were made today:  {NONE DEFAULTED:18576}  Labs/ tests ordered today include: *** No orders of the defined types were placed in this encounter.    Disposition: Follow up with {EPMDS:28135} or EP APP {EPFOLLOW UP:28173}   Signed, Maverik Foot, NP  09/23/23  7:11 PM  Electrophysiology [facility]

## 2023-09-24 ENCOUNTER — Ambulatory Visit: Payer: Medicare Other | Admitting: Cardiology

## 2023-09-24 ENCOUNTER — Other Ambulatory Visit: Payer: Self-pay

## 2023-09-24 ENCOUNTER — Telehealth: Payer: Self-pay

## 2023-09-24 ENCOUNTER — Ambulatory Visit: Attending: Cardiology

## 2023-09-24 DIAGNOSIS — R002 Palpitations: Secondary | ICD-10-CM

## 2023-09-24 DIAGNOSIS — I471 Supraventricular tachycardia, unspecified: Secondary | ICD-10-CM

## 2023-09-24 NOTE — Telephone Encounter (Signed)
-----   Message from Nicholls Riddle sent at 09/23/2023  7:15 PM EDT ----- This patient had a two week monitor ordered that was not completed. We are seeing each other tomorrow to discuss the zio results.   Could you call her please to follow-up? Has she started monitor? Does she just want to chat without monitor results?

## 2023-09-24 NOTE — Telephone Encounter (Signed)
 Called patient facility- received number to [facility] [phone] who advised that she would cancel appointment today-   I resent out the heart monitor- they will be on the look out, they will make sure it is received and worn for 2 weeks and then will call for follow up appointment.

## 2023-10-20 DIAGNOSIS — I471 Supraventricular tachycardia, unspecified: Secondary | ICD-10-CM

## 2023-10-20 DIAGNOSIS — R002 Palpitations: Secondary | ICD-10-CM | POA: Diagnosis not present

## 2023-10-27 ENCOUNTER — Ambulatory Visit: Payer: Self-pay | Admitting: Cardiology

## 2023-12-08 ENCOUNTER — Other Ambulatory Visit: Payer: Self-pay | Admitting: Cardiology

## 2023-12-30 NOTE — Progress Notes (Deleted)
 Consult History and Physical   SERVICE: Gynecology ***  Patient Name: Briana Young Patient MRN:   [id number]  CC: Patient is an intellectually disabled G0P0 referred by Dr. Maxie Buckles for pap smear under sedation.  She has never had a pap.  HPI: Briana Young is a 58 y.o. No obstetric history on file. with ***   Review of Systems: positives in bold GEN:   fevers, chills, weight changes, appetite changes, fatigue, night sweats HEENT:  HA, vision changes, hearing loss, congestion, rhinorrhea, sinus pressure, dysphagia CV:   CP, palpitations PULM:  SOB, cough GI:  abd pain, N/V/D/C GU:  dysuria, urgency, frequency MSK:  arthralgias, myalgias, back pain, swelling SKIN:  rashes, color changes, pallor NEURO:  numbness, weakness, tingling, seizures, dizziness, tremors PSYCH:  depression, anxiety, behavioral problems, confusion  HEME/LYMPH:  easy bruising or bleeding ENDO:  heat/cold intolerance  Past Obstetrical History: OB History   No obstetric history on file.     Past Gynecologic History: No LMP recorded. Patient has had a hysterectomy. Menstrual frequency Q *** wks lasting *** days requiring *** pads/day,  *** for night time symptoms  Past Medical History: Past Medical History:  Diagnosis Date   Allergic rhinitis    Diabetes mellitus without complication (HCC)    Pt takes Metformin   Diastolic dysfunction    a. 04/2021 Echo: EF 55-60%, no rwma, GrI DD, Nl RV fxn, RVSP 19.76mmHg. Mild MR/AI.   Gingival hyperplasia    Hyperlipidemia    Hypertension    Mental retardation    Obesity    PAF (paroxysmal atrial fibrillation) (HCC)    a. 02/2011 Developed AF in the setting of PE following pancreatic mass excision.  No known recurrence -->CHA2DS2VASc = 3.   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   PSVT (paroxysmal supraventricular tachycardia) (HCC)    a. 03/2021 Seen in ED for PSVT.   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  Social History:  Social History   Socioeconomic History   Marital status: Single    Spouse name: Not on file   Number of children: Not on file   Years of education: Not on file   Highest education level: Not on file  Occupational History   Not on file  Tobacco Use   Smoking status: Never   Smokeless tobacco: Never  Vaping Use   Vaping status: Not on file  Substance and Sexual Activity   Alcohol use: No   Drug use: Never   Sexual activity: Not on file  Other Topics Concern   Not on file  Social History Narrative   Not on file   Social Drivers of Health   Financial Resource Strain: Not on file  Food Insecurity: No Food Insecurity (03/13/2023)   Hunger Vital Sign    Worried About Running Out of Food in the Last Year: Never true    Ran Out of Food in the Last Year: Never true  Transportation Needs: No Transportation Needs (03/13/2023)   PRAPARE - Transportation    Lack of  Transportation (Medical): No    Lack of Transportation (Non-Medical): No  Physical Activity: Not on file  Stress: Not on file  Social Connections: Not on file  Intimate Partner Violence: Not At Risk (03/13/2023)   Humiliation, Afraid, Rape, and Kick questionnaire    Fear of Current or Ex-Partner: No    Emotionally Abused: No    Physically Abused: No    Sexually Abused: No    Home Medications:  Medications reconciled in EPIC  Current Outpatient Medications on File Prior to Visit  Medication Sig Dispense Refill   acetaminophen  (TYLENOL ) 500 MG tablet Take 500 mg by mouth every 6 (six) hours as needed.     aspirin  EC 81 MG  tablet Take 81 mg by mouth daily. Swallow whole.     atorvastatin  (LIPITOR) 20 MG tablet Take 20 mg by mouth daily.     carbamide peroxide (DEBROX) 6.5 % OTIC solution Place 5-10 drops into both ears 2 (two) times daily as needed (Ear wax removal).     clotrimazole (LOTRIMIN) 1 % cream Apply 1 application topically 2 (two) times daily.     fexofenadine (ALLEGRA) 180 MG tablet Take 180 mg by mouth daily.     fluticasone (FLONASE) 50 MCG/ACT nasal spray Place 2 sprays into both nostrils daily.     hydrocortisone cream 1 % Apply 1 application topically 2 (two) times daily as needed for itching.     metFORMIN (GLUCOPHAGE) 500 MG tablet Take 500 mg by mouth 2 (two) times daily with a meal.     metoprolol  succinate (TOPROL -XL) 25 MG 24 hr tablet TAKE 1 TABLET BY MOUTH TWICE DAILY *DO NOT CRUSH OR CHEW* 60 tablet 10   Multiple Vitamin (MULTIVITAMIN) capsule Take 1 capsule by mouth daily.     olopatadine (PATANOL) 0.1 % ophthalmic solution Place 1 drop into both eyes daily as needed for allergies.     omeprazole (PRILOSEC) 20 MG capsule Take 40 mg by mouth daily.     PARoxetine  (PAXIL ) 40 MG tablet Take 40 mg by mouth daily.     polyethylene glycol (MIRALAX  / GLYCOLAX ) 17 g packet Take 17 g by mouth daily as needed.     pseudoephedrine (SUDAFED) 60 MG tablet Take 60 mg by mouth every 6 (six) hours as needed.     No current facility-administered medications on file prior to visit.    Allergies:  No Known Allergies  Physical Exam:  @VSRANGES @   General Appearance:  Well developed, well nourished, no acute distress, alert and oriented, cooperative and appears stated age HEENT:  Normocephalic atraumatic, extraocular movements intact, moist mucous membranes, neck supple with midline trachea and thyroid  without masses Cardiovascular:  Normal S1/S2, regular rate and rhythm, no murmurs, 2+ distal pulses Pulmonary:  clear to auscultation, no wheezes, rales or rhonchi, symmetric air entry, good air  exchange Abdomen:  Bowel sounds present, soft, nontender, nondistended, no abnormal masses or organomegaly, no epigastric pain Back: inspection of back is normal Extremities:  extremities normal, no tenderness, atraumatic, no cyanosis or edema Skin:  normal coloration and turgor, no rashes, no suspicious skin lesions noted  Neurologic:  Cranial nerves 2-12 grossly intact, grossly equal strength and muscle tone, normal speech, no focal findings or movement disorder noted. Psychiatric:  Normal mood and affect, appropriate, no AH/VH Pelvic:  NEFG, no vulvar masses or lesions, normal vaginal mucosa, no vaginal bleeding or discharge, cervix without lesions or erythema, ***uterus, no adnexal masses appreciated, *** no palpable nodularity on rectovaginal exam, no pelvic  organ prolapse,    Labs/Studies:   CBC and Coags:  Lab Results  Component Value Date   WBC 8.7 06/17/2023   NEUTOPHILPCT 64 03/11/2023   EOSPCT 0 03/11/2023   BASOPCT 1 03/11/2023   LYMPHOPCT 30 03/11/2023   HGB 11.5 (L) 06/17/2023   HCT 35.6 (L) 06/17/2023   MCV 86.6 06/17/2023   PLT 430 (H) 06/17/2023   CMP:  Lab Results  Component Value Date   NA 136 06/17/2023   K 4.1 06/17/2023   CL 102 06/17/2023   CO2 24 06/17/2023   BUN 32 (H) 06/17/2023   CREATININE 1.50 (H) 06/17/2023   CREATININE 1.23 (H) 06/08/2023   CREATININE 1.04 (H) 03/13/2023   PROT 7.3 03/11/2023   BILITOT 0.5 03/11/2023   BILIDIR <0.1 04/10/2021   ALT 50 (H) 03/11/2023   AST 70 (H) 03/11/2023   ALKPHOS 93 03/11/2023   Other Labs: ***  TVUS:  *** Other Imaging: No results found.   Assessment / Plan:   Shamika K Mcelwain is a 58 y.o. No obstetric history on file. who presents with ***  1. ***   Thank you for the opportunity to be involved with this patient's care.  -----  [facility]

## 2023-12-31 ENCOUNTER — Encounter: Admitting: Obstetrics

## 2024-01-17 ENCOUNTER — Encounter: Payer: Self-pay | Admitting: Obstetrics and Gynecology

## 2024-01-23 ENCOUNTER — Other Ambulatory Visit: Payer: Self-pay | Admitting: Family Medicine

## 2024-01-23 ENCOUNTER — Encounter: Payer: Self-pay | Admitting: Obstetrics and Gynecology

## 2024-01-23 ENCOUNTER — Ambulatory Visit: Admitting: Obstetrics and Gynecology

## 2024-01-23 VITALS — BP 117/74 | HR 65 | Ht 68.0 in | Wt 154.7 lb

## 2024-01-23 DIAGNOSIS — Z1231 Encounter for screening mammogram for malignant neoplasm of breast: Secondary | ICD-10-CM

## 2024-01-23 DIAGNOSIS — Z01419 Encounter for gynecological examination (general) (routine) without abnormal findings: Secondary | ICD-10-CM

## 2024-01-23 DIAGNOSIS — Z90710 Acquired absence of both cervix and uterus: Secondary | ICD-10-CM

## 2024-01-23 DIAGNOSIS — Z124 Encounter for screening for malignant neoplasm of cervix: Secondary | ICD-10-CM

## 2024-01-23 DIAGNOSIS — Z7689 Persons encountering health services in other specified circumstances: Secondary | ICD-10-CM

## 2024-01-23 NOTE — Progress Notes (Signed)
 HPI:      Ms. Briana Young is a 58 y.o. G0P0000 who LMP was No LMP recorded. Patient has had a hysterectomy.  Subjective:   She presents today for her well woman visit.  She is currently staying in a group home and has some decreased mentation.  She is able to answer her own questions. She previously had a hysterectomy and bilateral oophorectomy many years ago.  She is unsure whether or not her cervix was taken at that time.  She would like to be examined today to see if she has a cervix and need Pap smears going forward. She has no complaints today denies issues.    Hx: The following portions of the patient's history were reviewed and updated as appropriate:             She  has a past medical history of Allergic rhinitis, Diabetes mellitus without complication (HCC), Diastolic dysfunction, Gingival hyperplasia, Hyperlipidemia, Hypertension, Mental retardation, Obesity, PAF (paroxysmal atrial fibrillation) (HCC), Pancreatic mass, PSVT (paroxysmal supraventricular tachycardia) (HCC), and Pulmonary embolism (HCC) (2012). She does not have any pertinent problems on file. 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 Her family history includes Heart disease in her father. She  reports that she has never smoked. She has never used smokeless tobacco. She reports that she does not drink alcohol and does not use drugs. She has a current medication list which includes the following prescription(s): acetaminophen, aspirin ec, atorvastatin, clotrimazole, fexofenadine, fluticasone, hydrocortisone cream, metformin, metoprolol succinate, multivitamin, olopatadine, omeprazole, paroxetine, polyethylene glycol, and carbamide peroxide. She has no known allergies.       Review of Systems:  Review of Systems  Constitutional: Denied constitutional symptoms, night sweats, recent  illness, fatigue, fever, insomnia and weight loss.  Eyes: Denied eye symptoms, eye pain, photophobia, vision change and visual disturbance.  Ears/Nose/Throat/Neck: Denied ear, nose, throat or neck symptoms, hearing loss, nasal discharge, sinus congestion and sore throat.  Cardiovascular: Denied cardiovascular symptoms, arrhythmia, chest pain/pressure, edema, exercise intolerance, orthopnea and palpitations.  Respiratory: Denied pulmonary symptoms, asthma, pleuritic pain, productive sputum, cough, dyspnea and wheezing.  Gastrointestinal: Denied, gastro-esophageal reflux, melena, nausea and vomiting.  Genitourinary: Denied genitourinary symptoms including symptomatic vaginal discharge, pelvic relaxation issues, and urinary complaints.  Musculoskeletal: Denied musculoskeletal symptoms, stiffness, swelling, muscle weakness and myalgia.  Dermatologic: Denied dermatology symptoms, rash and scar.  Neurologic: Denied neurology symptoms, dizziness, headache, neck pain and syncope.  Psychiatric: Denied psychiatric symptoms, anxiety and depression.  Endocrine: Denied endocrine symptoms including hot flashes and night sweats.   Meds:   Current Outpatient Medications on File Prior to Visit  Medication Sig Dispense Refill   acetaminophen (TYLENOL) 500 MG tablet Take 500 mg by mouth every 6 (six) hours as needed.     aspirin EC 81 MG tablet Take 81 mg by mouth daily. Swallow whole.     atorvastatin (LIPITOR) 20 MG tablet Take 20 mg by mouth daily.     clotrimazole (LOTRIMIN) 1 % cream Apply 1 application topically 2 (two) times daily.     fexofenadine (ALLEGRA) 180 MG tablet Take 180 mg by mouth daily.     fluticasone (FLONASE) 50 MCG/ACT nasal spray Place 2 sprays into both nostrils daily.     hydrocortisone cream 1 % Apply 1 application topically 2 (two) times daily as needed for itching.     metFORMIN (GLUCOPHAGE) 500 MG tablet Take 500 mg by mouth 2 (  two) times daily with a meal.     metoprolol  succinate (TOPROL-XL) 25 MG 24 hr tablet TAKE 1 TABLET BY MOUTH TWICE DAILY *DO NOT CRUSH OR CHEW* 60 tablet 10   Multiple Vitamin (MULTIVITAMIN) capsule Take 1 capsule by mouth daily.     olopatadine (PATANOL) 0.1 % ophthalmic solution Place 1 drop into both eyes daily as needed for allergies.     omeprazole (PRILOSEC) 20 MG capsule Take 40 mg by mouth daily.     PARoxetine (PAXIL) 40 MG tablet Take 40 mg by mouth daily.     polyethylene glycol (MIRALAX / GLYCOLAX) 17 g packet Take 17 g by mouth daily as needed.     carbamide peroxide (DEBROX) 6.5 % OTIC solution Place 5-10 drops into both ears 2 (two) times daily as needed (Ear wax removal). (Patient not taking: Reported on 01/23/2024)     No current facility-administered medications on file prior to visit.     Objective:     Vitals:   01/23/24 0816  BP: 117/74  Pulse: 65    Filed Weights   01/23/24 0816  Weight: 154 lb 11.2 oz (70.2 kg)              Physical examination   Pelvic:   Vulva: Normal appearance.  No lesions.  Vagina: No lesions or abnormalities noted.  Support: Normal pelvic support.  Urethra No masses tenderness or scarring.  Meatus Normal size without lesions or prolapse.  Cervix: Surgically absent!  Anus: Normal exam.  No lesions.  Perineum: Normal exam.  No lesions.    Assessment:    G0P0000 Patient Active Problem List   Diagnosis Date Noted   Paroxysmal atrial fibrillation (HCC) 06/17/2023   Headache 03/12/2023   Dizziness 03/12/2023   Acute headache 03/12/2023   Constipation 03/12/2023   Hypomagnesemia 03/12/2023   Stage 3a chronic kidney disease (HCC) 03/12/2023   History of removal of pancreatic cyst 2012 03/12/2023   Hyponatremia 03/12/2023   Intellectual disability 03/12/2023   Diabetes mellitus without complication (HCC)    Palpitations 02/14/2022   Hypertension associated with type 2 diabetes mellitus (HCC) 02/14/2022   PSVT (paroxysmal supraventricular tachycardia) (HCC) 03/29/2011    Hyperlipidemia associated with type 2 diabetes mellitus (HCC) 03/29/2011   Pulmonary embolism (HCC) 03/29/2011   Pancreatic disorder 03/29/2011     1. Establishing care with new doctor, encounter for   2. Well woman exam with routine gynecological exam   3. Cervical cancer screening   4. Screening mammogram for breast cancer     Not having any GYN issues.  Cervix absent   Plan:            1.  Basic Screening Recommendations The basic screening recommendations for asymptomatic women were discussed with the patient during her visit.  The age-appropriate recommendations were discussed with her and the rational for the tests reviewed.  When I am informed by the patient that another primary care physician has previously obtained the age-appropriate tests and they are up-to-date, only outstanding tests are ordered and referrals given as necessary.  Abnormal results of tests will be discussed with her when all of her results are completed.  Routine preventative health maintenance measures emphasized: Exercise/Diet/Weight control, Tobacco Warnings, Alcohol/Substance use risks and Stress Management  Mammogram ordered 2.  As patient has had a previous hysterectomy and oophorectomy which included trachelectomy she will no longer need Pap smears.  Pelvic exams can be limited to times when she has pelvic symptoms.  Orders  Orders Placed This Encounter  Procedures   MM DIGITAL SCREENING BILATERAL    No orders of the defined types were placed in this encounter.        F/U  No follow-ups on file.  Alm DOROTHA Sar, M.D. 01/23/2024 8:27 AM

## 2024-01-29 ENCOUNTER — Encounter: Admitting: Adult Health

## 2024-03-03 ENCOUNTER — Ambulatory Visit

## 2024-03-31 ENCOUNTER — Ambulatory Visit
Admission: RE | Admit: 2024-03-31 | Discharge: 2024-03-31 | Disposition: A | Discharge status: Home / Self Care | Source: Ambulatory Visit | Attending: Family Medicine | Admitting: Family Medicine

## 2024-03-31 DIAGNOSIS — Z1231 Encounter for screening mammogram for malignant neoplasm of breast: Secondary | ICD-10-CM | POA: Insufficient documentation

## 2024-04-30 ENCOUNTER — Ambulatory Visit (INDEPENDENT_AMBULATORY_CARE_PROVIDER_SITE_OTHER): Admitting: Podiatry

## 2024-04-30 DIAGNOSIS — Q828 Other specified congenital malformations of skin: Secondary | ICD-10-CM | POA: Diagnosis not present

## 2024-04-30 NOTE — Progress Notes (Signed)
 Subjective:  Patient ID: Briana Young, female    DOB: [birth year],  MRN: [id number]  Chief Complaint  Patient presents with   Callouses    58 y.o. female presents with the above complaint.  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  Patient states painful to touch painful to walk on has progressive gotten worse.  She would like to get this evaluated.  She has not seen anyone else prior to seeing me.  Hurts with ambulation and hurts with taking step.  She wears [employer].  Pain scale 7 out of 10.   Review of Systems: Negative except as noted in the HPI. Denies N/V/F/Ch.  Past Medical History:  Diagnosis Date   Allergic rhinitis    Diabetes mellitus without complication (HCC)    Pt takes Metformin   Diastolic dysfunction    a. 04/2021 Echo: EF 55-60%, no rwma, GrI DD, Nl RV fxn, RVSP 19.4mmHg. Mild MR/AI.   Gingival hyperplasia    Hyperlipidemia    Hypertension    Mental retardation    Obesity    PAF (paroxysmal atrial fibrillation) (HCC)    a. 02/2011 Developed AF in the setting of PE following pancreatic mass excision.  No known recurrence -->[id number] = 3.   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   PSVT (paroxysmal supraventricular tachycardia)    a. 03/2021 Seen in ED for PSVT.   Pulmonary embolism (HCC) 2012   on coumadin    Current Outpatient Medications:    acetaminophen  (TYLENOL ) 500 MG tablet, Take 500 mg by mouth every 6 (six) hours as needed., Disp: , Rfl:    aspirin  EC 81 MG tablet, Take 81 mg by mouth daily. Swallow whole., Disp: , Rfl:    atorvastatin  (LIPITOR) 20 MG tablet, Take 20 mg by mouth daily., Disp: , Rfl:    carbamide peroxide (DEBROX) 6.5 % OTIC solution, Place 5-10 drops into both ears 2 (two) times daily as needed (Ear wax removal). (Patient not taking: Reported on 01/23/2024), Disp: , Rfl:    clotrimazole (LOTRIMIN) 1 % cream, Apply 1 application topically 2 (two) times daily.,  Disp: , Rfl:    fexofenadine (ALLEGRA) 180 MG tablet, Take 180 mg by mouth daily., Disp: , Rfl:    fluticasone (FLONASE) 50 MCG/ACT nasal spray, Place 2 sprays into both nostrils daily., Disp: , Rfl:    hydrocortisone cream 1 %, Apply 1 application topically 2 (two) times daily as needed for itching., Disp: , Rfl:    metFORMIN (GLUCOPHAGE) 500 MG tablet, Take 500 mg by mouth 2 (two) times daily with a meal., Disp: , Rfl:    metoprolol  succinate (TOPROL -XL) 25 MG 24 hr tablet, TAKE 1 TABLET BY MOUTH TWICE DAILY *DO NOT CRUSH OR CHEW*, Disp: 60 tablet, Rfl: 10   Multiple Vitamin (MULTIVITAMIN) capsule, Take 1 capsule by mouth daily., Disp: , Rfl:    olopatadine (PATANOL) 0.1 % ophthalmic solution, Place 1 drop into both eyes daily as needed for allergies., Disp: , Rfl:    omeprazole (PRILOSEC) 20 MG capsule, Take 40 mg by mouth daily., Disp: , Rfl:    PARoxetine  (PAXIL ) 40 MG tablet, Take 40 mg by mouth daily., Disp: , Rfl:    polyethylene glycol (MIRALAX  / GLYCOLAX ) 17 g packet, Take 17 g by mouth daily as needed., Disp: , Rfl:   Social History   Tobacco Use  Smoking Status Never  Smokeless Tobacco Never    No Known Allergies Objective:  There  were no vitals filed for this visit. There is no height or weight on file to calculate BMI. Constitutional Well developed. Well nourished.  Vascular Dorsalis pedis pulses palpable bilaterally. Posterior tibial pulses palpable bilaterally. Capillary refill normal to all digits.  No cyanosis or clubbing noted. Pedal hair growth normal.  Neurologic Normal speech. Oriented to person, place, and time. Epicritic sensation to light touch grossly present bilaterally.  Dermatologic Hyperkeratotic lesion with central nucleated core noted to left submetatarsal 5.  Underlying plantarflexed fifth metatarsal noted.  Pain on palpation to the lesion.  Orthopedic: Normal joint ROM without pain or crepitus bilaterally. No visible deformities. No bony  tenderness.   Radiographs: None Assessment:   1. Porokeratosis      Plan:  Patient was evaluated and treated and all questions answered.  Left porokeratosis with underlying plantarflexed metatarsal -All questions and concerns were discussed with the patient in extensive detail.  Given the amount of pain that she is having I believe she will benefit from debridement of the lesion followed by excision of central nucleated core.  Using chisel blade handle the lesion was debrided down to healthy striated tissue.  No complication noted no pinpoint bleeding noted. -If there is no improvement we will discuss floating osteotomy during next clinical visit. -I discussed shoe gear modification and orthotics as well.  She states understanding. -She is diabetic with unknown A1c  No follow-ups on file.
